# Patient Record
Sex: Female | Born: 1935 | Race: White | Hispanic: No | Marital: Single | Smoking: Never smoker
Health system: Southern US, Community
[De-identification: ages and names within clinical notes are randomized; demographics above are authoritative.]

## PROBLEM LIST (undated history)

## (undated) DIAGNOSIS — F0390 Unspecified dementia without behavioral disturbance: Secondary | ICD-10-CM

## (undated) DIAGNOSIS — E785 Hyperlipidemia, unspecified: Secondary | ICD-10-CM

## (undated) DIAGNOSIS — G40909 Epilepsy, unspecified, not intractable, without status epilepticus: Secondary | ICD-10-CM

## (undated) HISTORY — PX: TONSILLECTOMY: SUR1361

## (undated) HISTORY — PX: HYSTEROTOMY: SHX1776

---

## 2013-12-03 ENCOUNTER — Emergency Department: Payer: Self-pay | Admitting: Emergency Medicine

## 2014-01-24 ENCOUNTER — Emergency Department: Payer: Self-pay | Admitting: Emergency Medicine

## 2014-03-27 ENCOUNTER — Emergency Department: Payer: Self-pay | Admitting: Emergency Medicine

## 2015-07-23 ENCOUNTER — Encounter: Payer: Self-pay | Admitting: Internal Medicine

## 2015-07-23 ENCOUNTER — Emergency Department: Payer: Medicare Other

## 2015-07-23 ENCOUNTER — Inpatient Hospital Stay
Admission: EM | Admit: 2015-07-23 | Discharge: 2015-07-27 | DRG: 064 | Disposition: A | Discharge status: Hospice | Payer: Medicare Other | Attending: Internal Medicine | Admitting: Internal Medicine

## 2015-07-23 DIAGNOSIS — E785 Hyperlipidemia, unspecified: Secondary | ICD-10-CM | POA: Diagnosis present

## 2015-07-23 DIAGNOSIS — R29810 Facial weakness: Secondary | ICD-10-CM | POA: Diagnosis present

## 2015-07-23 DIAGNOSIS — Z66 Do not resuscitate: Secondary | ICD-10-CM | POA: Diagnosis present

## 2015-07-23 DIAGNOSIS — G40909 Epilepsy, unspecified, not intractable, without status epilepticus: Secondary | ICD-10-CM | POA: Diagnosis present

## 2015-07-23 DIAGNOSIS — N390 Urinary tract infection, site not specified: Secondary | ICD-10-CM | POA: Diagnosis not present

## 2015-07-23 DIAGNOSIS — Z7982 Long term (current) use of aspirin: Secondary | ICD-10-CM

## 2015-07-23 DIAGNOSIS — Z993 Dependence on wheelchair: Secondary | ICD-10-CM

## 2015-07-23 DIAGNOSIS — Z515 Encounter for palliative care: Secondary | ICD-10-CM | POA: Diagnosis present

## 2015-07-23 DIAGNOSIS — E860 Dehydration: Secondary | ICD-10-CM | POA: Diagnosis present

## 2015-07-23 DIAGNOSIS — W19XXXA Unspecified fall, initial encounter: Secondary | ICD-10-CM | POA: Diagnosis present

## 2015-07-23 DIAGNOSIS — E039 Hypothyroidism, unspecified: Secondary | ICD-10-CM | POA: Diagnosis present

## 2015-07-23 DIAGNOSIS — G9340 Encephalopathy, unspecified: Secondary | ICD-10-CM | POA: Diagnosis present

## 2015-07-23 DIAGNOSIS — I639 Cerebral infarction, unspecified: Secondary | ICD-10-CM | POA: Diagnosis not present

## 2015-07-23 DIAGNOSIS — Z79899 Other long term (current) drug therapy: Secondary | ICD-10-CM

## 2015-07-23 DIAGNOSIS — F0390 Unspecified dementia without behavioral disturbance: Secondary | ICD-10-CM | POA: Diagnosis present

## 2015-07-23 DIAGNOSIS — R4182 Altered mental status, unspecified: Secondary | ICD-10-CM

## 2015-07-23 HISTORY — DX: Unspecified dementia, unspecified severity, without behavioral disturbance, psychotic disturbance, mood disturbance, and anxiety: F03.90

## 2015-07-23 HISTORY — DX: Epilepsy, unspecified, not intractable, without status epilepticus: G40.909

## 2015-07-23 HISTORY — DX: Hyperlipidemia, unspecified: E78.5

## 2015-07-23 LAB — CBC WITH DIFFERENTIAL/PLATELET
BASOS PCT: 0 %
Basophils Absolute: 0 10*3/uL (ref 0–0.1)
EOS ABS: 0.1 10*3/uL (ref 0–0.7)
EOS PCT: 2 %
HCT: 41.4 % (ref 35.0–47.0)
Hemoglobin: 13.9 g/dL (ref 12.0–16.0)
Lymphocytes Relative: 26 %
Lymphs Abs: 1.3 10*3/uL (ref 1.0–3.6)
MCH: 31 pg (ref 26.0–34.0)
MCHC: 33.7 g/dL (ref 32.0–36.0)
MCV: 91.8 fL (ref 80.0–100.0)
MONO ABS: 0.4 10*3/uL (ref 0.2–0.9)
MONOS PCT: 7 %
Neutro Abs: 3.3 10*3/uL (ref 1.4–6.5)
Neutrophils Relative %: 65 %
Platelets: 160 10*3/uL (ref 150–440)
RBC: 4.51 MIL/uL (ref 3.80–5.20)
RDW: 13.1 % (ref 11.5–14.5)
WBC: 5.1 10*3/uL (ref 3.6–11.0)

## 2015-07-23 LAB — COMPREHENSIVE METABOLIC PANEL
ALBUMIN: 3.9 g/dL (ref 3.5–5.0)
ALT: 12 U/L — ABNORMAL LOW (ref 14–54)
ANION GAP: 7 (ref 5–15)
AST: 17 U/L (ref 15–41)
Alkaline Phosphatase: 82 U/L (ref 38–126)
BUN: 19 mg/dL (ref 6–20)
CO2: 29 mmol/L (ref 22–32)
Calcium: 9.1 mg/dL (ref 8.9–10.3)
Chloride: 103 mmol/L (ref 101–111)
Creatinine, Ser: 0.98 mg/dL (ref 0.44–1.00)
GFR calc non Af Amer: 52 mL/min — ABNORMAL LOW (ref >60)
GFR, EST AFRICAN AMERICAN: 60 mL/min — AB (ref >60)
GLUCOSE: 105 mg/dL — AB (ref 65–99)
POTASSIUM: 4 mmol/L (ref 3.5–5.1)
SODIUM: 139 mmol/L (ref 135–145)
TOTAL PROTEIN: 6.4 g/dL — AB (ref 6.5–8.1)
Total Bilirubin: 0.3 mg/dL (ref 0.3–1.2)

## 2015-07-23 LAB — URINALYSIS COMPLETE WITH MICROSCOPIC (ARMC ONLY)
BILIRUBIN URINE: NEGATIVE
GLUCOSE, UA: NEGATIVE mg/dL
Ketones, ur: NEGATIVE mg/dL
LEUKOCYTES UA: NEGATIVE
NITRITE: POSITIVE — AB
PH: 7 (ref 5.0–8.0)
Protein, ur: NEGATIVE mg/dL
SPECIFIC GRAVITY, URINE: 1.013 (ref 1.005–1.030)

## 2015-07-23 LAB — T4, FREE: Free T4: 0.83 ng/dL (ref 0.61–1.12)

## 2015-07-23 LAB — TROPONIN I: Troponin I: <0.03 ng/mL (ref <0.031)

## 2015-07-23 LAB — MRSA PCR SCREENING: MRSA BY PCR: NEGATIVE

## 2015-07-23 LAB — TSH: TSH: 2.5 u[IU]/mL (ref 0.350–4.500)

## 2015-07-23 LAB — MAGNESIUM: Magnesium: 1.9 mg/dL (ref 1.7–2.4)

## 2015-07-23 MED ORDER — ADULT MULTIVITAMIN W/MINERALS CH
1.0000 | ORAL_TABLET | Freq: Every day | ORAL | Status: DC
  Administered 2015-07-24 – 2015-07-27 (×3): 1 via ORAL
  Filled 2015-07-23 (×4): qty 1

## 2015-07-23 MED ORDER — ASPIRIN 81 MG PO CHEW
81.0000 mg | CHEWABLE_TABLET | Freq: Every day | ORAL | Status: DC
  Administered 2015-07-24 – 2015-07-25 (×2): 81 mg via ORAL
  Filled 2015-07-23 (×2): qty 1

## 2015-07-23 MED ORDER — ENOXAPARIN SODIUM 40 MG/0.4ML ~~LOC~~ SOLN
40.0000 mg | SUBCUTANEOUS | Status: DC
  Administered 2015-07-23 – 2015-07-26 (×4): 40 mg via SUBCUTANEOUS
  Filled 2015-07-23 (×4): qty 0.4

## 2015-07-23 MED ORDER — CALCIUM CARBONATE-VITAMIN D 500-200 MG-UNIT PO TABS
1.0000 | ORAL_TABLET | Freq: Two times a day (BID) | ORAL | Status: DC
  Administered 2015-07-24 – 2015-07-27 (×5): 1 via ORAL
  Filled 2015-07-23 (×8): qty 1

## 2015-07-23 MED ORDER — PHENYTOIN SODIUM EXTENDED 100 MG PO CAPS
100.0000 mg | ORAL_CAPSULE | Freq: Every day | ORAL | Status: DC
  Administered 2015-07-24 – 2015-07-27 (×4): 100 mg via ORAL
  Filled 2015-07-23 (×4): qty 1

## 2015-07-23 MED ORDER — CEFTRIAXONE SODIUM 1 G IJ SOLR
1.0000 g | INTRAMUSCULAR | Status: DC
  Administered 2015-07-24 – 2015-07-25 (×2): 1 g via INTRAVENOUS
  Filled 2015-07-23 (×3): qty 10

## 2015-07-23 MED ORDER — SODIUM CHLORIDE 0.9 % IV BOLUS (SEPSIS)
1000.0000 mL | Freq: Once | INTRAVENOUS | Status: AC
Start: 2015-07-23 — End: 2015-07-23
  Administered 2015-07-23: 1000 mL via INTRAVENOUS

## 2015-07-23 MED ORDER — FLUTICASONE PROPIONATE 50 MCG/ACT NA SUSP
2.0000 | Freq: Every day | NASAL | Status: DC
Start: 2015-07-23 — End: 2015-07-27
  Administered 2015-07-24 – 2015-07-26 (×3): 2 via NASAL
  Filled 2015-07-23: qty 16

## 2015-07-23 MED ORDER — DEXTROSE 5 % IV SOLN: 1.0000 g | INTRAVENOUS | Status: DC

## 2015-07-23 MED ORDER — LEVOTHYROXINE SODIUM 50 MCG PO TABS
50.0000 ug | ORAL_TABLET | Freq: Every day | ORAL | Status: DC
  Administered 2015-07-24 – 2015-07-27 (×4): 50 ug via ORAL
  Filled 2015-07-23 (×4): qty 1

## 2015-07-23 MED ORDER — SODIUM CHLORIDE 0.9 % IV SOLN
INTRAVENOUS | Status: DC
  Administered 2015-07-23 – 2015-07-27 (×7): via INTRAVENOUS

## 2015-07-23 MED ORDER — ONDANSETRON HCL 4 MG PO TABS
4.0000 mg | ORAL_TABLET | Freq: Four times a day (QID) | ORAL | Status: DC | PRN
  Filled 2015-07-23: qty 1

## 2015-07-23 MED ORDER — ACETAMINOPHEN 325 MG PO TABS: 650.0000 mg | ORAL_TABLET | Freq: Four times a day (QID) | ORAL | Status: DC | PRN

## 2015-07-23 MED ORDER — ACETAMINOPHEN 650 MG RE SUPP: 650.0000 mg | Freq: Four times a day (QID) | RECTAL | Status: DC | PRN

## 2015-07-23 MED ORDER — SERTRALINE HCL 100 MG PO TABS
100.0000 mg | ORAL_TABLET | Freq: Every day | ORAL | Status: DC
  Administered 2015-07-24 – 2015-07-27 (×4): 100 mg via ORAL
  Filled 2015-07-23 (×4): qty 1

## 2015-07-23 MED ORDER — DEXTROSE 5 % IV SOLN
1.0000 g | Freq: Once | INTRAVENOUS | Status: AC
  Administered 2015-07-23: 1 g via INTRAVENOUS
  Filled 2015-07-23: qty 10

## 2015-07-23 MED ORDER — ONDANSETRON HCL 4 MG/2ML IJ SOLN: 4.0000 mg | Freq: Four times a day (QID) | INTRAMUSCULAR | Status: DC | PRN

## 2015-07-23 NOTE — ED Provider Notes (Signed)
[facility] Emergency Department Provider Note   ____________________________________________  Time seen: Approximately 1:45 PM  I have reviewed the triage vital signs and the nursing notes.   HISTORY  Chief Complaint Altered Mental Status  Caveat-history of present illness and review of systems Limited due to the patient's dementia.  HPI Heidi Osborne is a 82 y.o. female with history of dementia and hypothyroidism who presents for evaluation of fall and change in mental status since yesterday. History is provided by EMS on arrival. Per their report, she lives at [facility], she fell yesterday, she has been altered, she has seemed to need more assistance and is leaning to the side and drooling. No other history is obtained.   No past medical history on file.  There are no active problems to display for this patient.   No past surgical history on file.  Current Outpatient Rx  Name  Route  Sig  Dispense  Refill  . aspirin 81 MG chewable tablet   Oral   Chew 81 mg by mouth daily.         . calcium-vitamin D (OSCAL WITH D) 500-200 MG-UNIT tablet   Oral   Take 1 tablet by mouth 2 (two) times daily.         . fluticasone (FLONASE) 50 MCG/ACT nasal spray   Each Nare   Place 2 sprays into both nostrils daily.         Marland Kitchen levothyroxine (SYNTHROID, LEVOTHROID) 50 MCG tablet   Oral   Take 50 mcg by mouth daily before breakfast.         . Multiple Vitamin (MULTIVITAMIN WITH MINERALS) TABS tablet   Oral   Take 1 tablet by mouth daily.         . phenytoin (DILANTIN) 100 MG ER capsule   Oral   Take 100 mg by mouth daily.         . sertraline (ZOLOFT) 100 MG tablet   Oral   Take 100 mg by mouth daily.           Allergies Review of patient's allergies indicates no known allergies.  No family history on file.  Social History Social History  Substance Use Topics  . Smoking status: Not on file  .  Smokeless tobacco: Not on file  . Alcohol Use: Not on file    Review of Systems  Caveat-history of present illness and review of systems Limited due to the patient's dementia. ____________________________________________   PHYSICAL EXAM:  VITAL SIGNS: ED Triage Vitals  Enc Vitals Group     BP 07/23/15 1258 136/78 mmHg     Pulse Rate 07/23/15 1258 69     Resp 07/23/15 1258 18     Temp 07/23/15 1258 98.4 F (36.9 C)     Temp Source 07/23/15 1258 Axillary     SpO2 07/23/15 1258 97 %     Weight 07/23/15 1258 134 lb 8 oz (61.009 kg)     Height 07/23/15 1258 5\' 4"  (1.626 m)     Head Cir --      Peak Flow --      Pain Score --      Pain Loc --      Pain Edu? --      Excl. in GC? --     Constitutional: Alert and oriented To self only but not to year or to place, confused, babbling but able to follow commands. in no acute distress. Eyes:  Conjunctivae are normal. PERRL. EOMI. Head: Atraumatic. Nose: No congestion/rhinnorhea. Mouth/Throat: Mucous membranes are dry.  Oropharynx non-erythematous. Neck: No stridor.  No cervical spine tenderness to palpation. Cardiovascular: Normal rate, regular rhythm. Grossly normal heart sounds.  Good peripheral circulation. Respiratory: Normal respiratory effort.  No retractions. Lungs CTAB. Gastrointestinal: Soft and nontender. No distention.  No CVA tenderness. Genitourinary: deferred Musculoskeletal: No lower extremity tenderness nor edema.  No joint effusions. Neurologic:  There does not appear to be any dysarthria but the exam is limited as the patient is babbling for the most part. She moves all extremities equally and to command. Skin:  Skin is warm, dry and intact. No rash noted. Psychiatric: Mood and affect are normal. Speech and behavior are normal.  ____________________________________________   LABS (all labs ordered are listed, but only abnormal results are displayed)  Labs Reviewed  COMPREHENSIVE METABOLIC PANEL - Abnormal;  Notable for the following:    Glucose, Bld 105 (*)    Total Protein 6.4 (*)    ALT 12 (*)    GFR calc non Af Amer 52 (*)    GFR calc Af Amer 60 (*)    All other components within normal limits  URINALYSIS COMPLETEWITH MICROSCOPIC ([facility] ONLY) - Abnormal; Notable for the following:    Color, Urine YELLOW (*)    APPearance CLOUDY (*)    Hgb urine dipstick 1+ (*)    Nitrite POSITIVE (*)    Bacteria, UA RARE (*)    Squamous Epithelial / LPF 0-5 (*)    All other components within normal limits  CULTURE, BLOOD (ROUTINE X 2)  CULTURE, BLOOD (ROUTINE X 2)  CBC WITH DIFFERENTIAL/PLATELET  TROPONIN I  MAGNESIUM  TSH  T4, FREE  PHENYTOIN LEVEL, FREE AND TOTAL   ____________________________________________  EKG  ED ECG REPORT I, Gayla Doss, the attending physician, personally viewed and interpreted this ECG.   Date: 07/23/2015  EKG Time: 13:01  Rate: 67  Rhythm: normal sinus rhythm  Axis: normal  Intervals: Left anterior fascicular block.  ST&T Change: No acute ST elevation. No acute ST depression.  ____________________________________________  RADIOLOGY  CT head IMPRESSION: Atrophy with periventricular small vessel disease. Prior small infarct anterior left thalamus. No acute infarct evident. No hemorrhage, extra-axial fluid collection, or mass effect.   CXR IMPRESSION: No acute cardiopulmonary disease on this AP portable examination.  ____________________________________________   PROCEDURES  Procedure(s) performed: None  Critical Care performed: No  ____________________________________________   INITIAL IMPRESSION / ASSESSMENT AND PLAN / ED COURSE  Pertinent labs & imaging results that were available during my care of the patient were reviewed by me and considered in my medical decision making (see chart for details).  Heidi Osborne is a 81 y.o. female with history of dementia and hypothyroidism who presents for evaluation of fall and change in  mental status since yesterday. Her vital signs are stable and she is afebrile, she does appear very confused but has a nonfocal neurological examination. I reviewed her labs, CBC and CMP unremarkable, negative troponin, urinalysis is concerning for nitrite positive UTI, we'll give ceftriaxone. No acute intracranial process on CT head, chest x-ray shows no acute cardiopulmonary disease. Suspect UTI as the most likely cause of her altered mental status, weakness, case discussed with the hospitalist, Dr. Allena Katz for admission at 3:45 PM. ____________________________________________   FINAL CLINICAL IMPRESSION(S) / ED DIAGNOSES  Final diagnoses:  UTI (lower urinary tract infection)  Altered mental status, unspecified altered mental status type  NEW MEDICATIONS STARTED DURING THIS VISIT:  New Prescriptions   No medications on file     Note:  This document was prepared using Dragon voice recognition software and may include unintentional dictation errors.    Gayla DossEryka A  Ducre, MD 07/23/15 [phone]

## 2015-07-23 NOTE — Care Management Obs Status (Signed)
MEDICARE OBSERVATION STATUS NOTIFICATION   Patient Details  Name: Heidi AguasHelen Osborne MRN: [id number] Date of Birth: January 19, 1930   Medicare Observation Status Notification Given:  Yes    Eber HongGreene, Emi Lymon R, RN 07/23/2015, 4:40 PM

## 2015-07-23 NOTE — ED Notes (Signed)
[facility] for reports of fall yesterday and altered mental status. Staff report pt seems to need more assistance today. Staff report pt leaning to one side and drooling. EMS 164/84, 72 HR, 98.2 axillary temperature, CBG 140.

## 2015-07-23 NOTE — H&P (Signed)
[facility] [place] at [facility]   PATIENT NAME: Heidi Osborne    MR#:  [id number]  DATE OF BIRTH:  Nov 24, 1929  DATE OF ADMISSION:  07/23/2015  PRIMARY CARE PHYSICIAN: CRIM, [place] DAVID, MD   REQUESTING/REFERRING PHYSICIAN: Dr. Toney Rakes  CHIEF COMPLAINT:   Chief Complaint  Patient presents with  . Altered Mental Status    HISTORY OF PRESENT ILLNESS: Heidi Osborne  is a 82 y.o. female with a known history of  Epilepsy, dementia, hyperlipidemia, hypothyroidism brought into the emergency room with altered mental status. Patient's daughter is at the bedside and is able to provide her history. She reports that her mother normally is wheelchair bound for the past few years. But she does try to get out of bed and earlier this morning she fell and was on the floor but did not have any injuries. Then around afternoon time they noticed that she was more confused and had a facial droop. Patient brought to the ER. She had a CT scan of the head which is normal daughter reports that she's back to her baseline. She did have a abnormal UA. Patient with dementia and unable to provide any review of systems.        PAST MEDICAL HISTORY:   Past Medical History  Diagnosis Date  . Epilepsy (HCC)   . Dementia   . Hyperlipemia     PAST SURGICAL HISTORY: Past Surgical History  Procedure Laterality Date  . Hysterotomy    . Tonsillectomy      SOCIAL HISTORY:  Social History  Substance Use Topics  . Smoking status: Never Smoker   . Smokeless tobacco: Not on file  . Alcohol Use: No    FAMILY HISTORY:  Family History  Problem Relation Age of Onset  . Colon cancer    . Heart disease    . Crohn's disease      DRUG ALLERGIES: No Known Allergies  REVIEW OF SYSTEMS:   CONSTITUTIONAL: Unable to provide MEDICATIONS AT HOME:  Prior to Admission medications   Medication Sig Start Date End Date Taking? Authorizing Provider  aspirin 81 MG chewable tablet Chew 81  mg by mouth daily.   Yes Historical Provider, MD  calcium-vitamin D (OSCAL WITH D) 500-200 MG-UNIT tablet Take 1 tablet by mouth 2 (two) times daily.   Yes Historical Provider, MD  fluticasone (FLONASE) 50 MCG/ACT nasal spray Place 2 sprays into both nostrils daily.   Yes Historical Provider, MD  levothyroxine (SYNTHROID, LEVOTHROID) 50 MCG tablet Take 50 mcg by mouth daily before breakfast.   Yes Historical Provider, MD  Multiple Vitamin (MULTIVITAMIN WITH MINERALS) TABS tablet Take 1 tablet by mouth daily.   Yes Historical Provider, MD  phenytoin (DILANTIN) 100 MG ER capsule Take 100 mg by mouth daily.   Yes Historical Provider, MD  sertraline (ZOLOFT) 100 MG tablet Take 100 mg by mouth daily.   Yes Historical Provider, MD      PHYSICAL EXAMINATION:   VITAL SIGNS: Blood pressure 136/78, pulse 69, temperature 98.4 F (36.9 C), temperature source Axillary, resp. rate 18, height 5\' 4"  (1.626 m), weight 61.009 kg (134 lb 8 oz), SpO2 97 %.  GENERAL:  82 y.o.-year-old patient lying in the bed with no acute distress.  EYES: Pupils equal, round, reactive to light and accommodation. No scleral icterus. Extraocular muscles intact.  HEENT: Head atraumatic, normocephalic. Oropharynx and nasopharynx clear.  NECK:  Supple, no jugular venous distention. No thyroid enlargement, no tenderness.  LUNGS: Normal  breath sounds bilaterally, no wheezing, rales,rhonchi or crepitation. No use of accessory muscles of respiration.  CARDIOVASCULAR: S1, S2 normal. No murmurs, rubs, or gallops.  ABDOMEN: Soft, nontender, nondistended. Bowel sounds present. No organomegaly or mass.  EXTREMITIES: No pedal edema, cyanosis, or clubbing.  NEUROLOGIC: Cranial nerves II through XII are intact. Muscle strength 5/5 in all extremities. Sensation intact. Gait not checked.  PSYCHIATRIC: The patient is alert and oriented x 3.  SKIN: No obvious rash, lesion, or ulcer.   LABORATORY PANEL:   CBC  Recent Labs Lab 07/23/15 1310   WBC 5.1  HGB 13.9  HCT 41.4  PLT 160  MCV 91.8  MCH 31.0  MCHC 33.7  RDW 13.1  LYMPHSABS 1.3  MONOABS 0.4  EOSABS 0.1  BASOSABS 0.0   ------------------------------------------------------------------------------------------------------------------  Chemistries   Recent Labs Lab 07/23/15 1310  NA 139  K 4.0  CL 103  CO2 29  GLUCOSE 105*  BUN 19  CREATININE 0.98  CALCIUM 9.1  MG 1.9  AST 17  ALT 12*  ALKPHOS 82  BILITOT 0.3   ------------------------------------------------------------------------------------------------------------------ estimated creatinine clearance is 36.9 mL/min (by C-G formula based on Cr of 0.98). ------------------------------------------------------------------------------------------------------------------  Recent Labs  07/23/15 1310  TSH 2.500     Coagulation profile No results for input(s): INR, PROTIME in the last 168 hours. ------------------------------------------------------------------------------------------------------------------- No results for input(s): DDIMER in the last 72 hours. -------------------------------------------------------------------------------------------------------------------  Cardiac Enzymes  Recent Labs Lab 07/23/15 1310  TROPONINI <0.03   ------------------------------------------------------------------------------------------------------------------ Invalid input(s): POCBNP  ---------------------------------------------------------------------------------------------------------------  Urinalysis    Component Value Date/Time   COLORURINE YELLOW* 07/23/2015 1312   APPEARANCEUR CLOUDY* 07/23/2015 1312   LABSPEC 1.013 07/23/2015 1312   PHURINE 7.0 07/23/2015 1312   GLUCOSEU NEGATIVE 07/23/2015 1312   HGBUR 1+* 07/23/2015 1312   BILIRUBINUR NEGATIVE 07/23/2015 1312   KETONESUR NEGATIVE 07/23/2015 1312   PROTEINUR NEGATIVE 07/23/2015 1312   NITRITE POSITIVE* 07/23/2015 1312    LEUKOCYTESUR NEGATIVE 07/23/2015 1312     RADIOLOGY: Ct Head Wo Contrast  07/23/2015  CLINICAL DATA:  Pain and weakness following fall ; altered mental status EXAM: CT HEAD WITHOUT CONTRAST TECHNIQUE: Contiguous axial images were obtained from the base of the skull through the vertex without intravenous contrast. COMPARISON:  March 27, 2014 FINDINGS: Moderate diffuse atrophy is stable. There is no intracranial mass, hemorrhage, extra-axial fluid collection, or midline shift. There is patchy small vessel disease throughout the centra semiovale bilaterally. There is a prior small infarct in the inferior anterior left thalamus, stable. No acute infarct is evident. The bony calvarium appears intact. The mastoid air cells are clear. No intraorbital lesions are evident. IMPRESSION: Atrophy with periventricular small vessel disease. Prior small infarct anterior left thalamus. No acute infarct evident. No hemorrhage, extra-axial fluid collection, or mass effect. Electronically Signed   By: Bretta BangWilliam  Woodruff III M.D.   On: 07/23/2015 14:48   Dg Chest Portable 1 View  07/23/2015  CLINICAL DATA:  Fall yesterday.  Altered mental status. EXAM: PORTABLE CHEST 1 VIEW COMPARISON:  None. FINDINGS: Borderline enlarged cardiac silhouette and mediastinal contours. Punctate (approximately 5 mm) granuloma overlies the medial aspect of the left upper lung. No focal airspace opacities. No pleural effusion. No evidence of edema. No acute osseous abnormalities. IMPRESSION: No acute cardiopulmonary disease on this AP portable examination. Electronically Signed   By: Simonne ComeJohn  Watts M.D.   On: 07/23/2015 13:57    EKG: Orders placed or performed during the hospital encounter of 07/23/15  . ED EKG  . ED EKG  .  EKG 12-Lead  . EKG 12-Lead    IMPRESSION AND PLAN: Patient is 82 year old white female with dementia who is being admitted after fall and mild confusion  1. Acute encephalopathy due to combination of dehydration and  UTI We'll continue IV fluids Obtain urine cultures IV ceftriaxone  2. Hypothyroidism continue level thyroxine  3. Seizure  Dilantin  4. dmentia not on any medications Continue zoloft  5. Code DNR    All the records are reviewed and case discussed with ED provider. Management plans discussed with the patient, family and they are in agreement.  CODE STATUS: Code Status History    This patient does not have a recorded code status. Please follow your organizational policy for patients in this situation.       TOTAL TIME TAKING CARE OF THIS PATIENT:55 minutes.    Auburn Bilberry M.D on 07/23/2015 at 4:08 PM  Between 7am to 6pm - Pager - [phone]  After 6pm go to [email or web address] - password [email or web address] [facility]  [place] [facility]  Office  [phone]  CC: Primary care physician; CRIM, [place] DAVID, MD

## 2015-07-24 ENCOUNTER — Observation Stay: Payer: Medicare Other

## 2015-07-24 DIAGNOSIS — I639 Cerebral infarction, unspecified: Secondary | ICD-10-CM | POA: Diagnosis present

## 2015-07-24 DIAGNOSIS — G40909 Epilepsy, unspecified, not intractable, without status epilepticus: Secondary | ICD-10-CM | POA: Diagnosis present

## 2015-07-24 DIAGNOSIS — R29810 Facial weakness: Secondary | ICD-10-CM | POA: Diagnosis present

## 2015-07-24 DIAGNOSIS — Z515 Encounter for palliative care: Secondary | ICD-10-CM | POA: Diagnosis present

## 2015-07-24 DIAGNOSIS — G9340 Encephalopathy, unspecified: Secondary | ICD-10-CM | POA: Diagnosis present

## 2015-07-24 DIAGNOSIS — E860 Dehydration: Secondary | ICD-10-CM | POA: Diagnosis present

## 2015-07-24 DIAGNOSIS — Z7982 Long term (current) use of aspirin: Secondary | ICD-10-CM | POA: Diagnosis not present

## 2015-07-24 DIAGNOSIS — E785 Hyperlipidemia, unspecified: Secondary | ICD-10-CM | POA: Diagnosis present

## 2015-07-24 DIAGNOSIS — F0390 Unspecified dementia without behavioral disturbance: Secondary | ICD-10-CM | POA: Diagnosis present

## 2015-07-24 DIAGNOSIS — Z993 Dependence on wheelchair: Secondary | ICD-10-CM | POA: Diagnosis not present

## 2015-07-24 DIAGNOSIS — E039 Hypothyroidism, unspecified: Secondary | ICD-10-CM | POA: Diagnosis present

## 2015-07-24 DIAGNOSIS — Z79899 Other long term (current) drug therapy: Secondary | ICD-10-CM | POA: Diagnosis not present

## 2015-07-24 DIAGNOSIS — N390 Urinary tract infection, site not specified: Secondary | ICD-10-CM | POA: Diagnosis present

## 2015-07-24 DIAGNOSIS — Z66 Do not resuscitate: Secondary | ICD-10-CM | POA: Diagnosis present

## 2015-07-24 LAB — CBC
HEMATOCRIT: 37.3 % (ref 35.0–47.0)
HEMOGLOBIN: 12.3 g/dL (ref 12.0–16.0)
MCH: 30.9 pg (ref 26.0–34.0)
MCHC: 32.9 g/dL (ref 32.0–36.0)
MCV: 93.7 fL (ref 80.0–100.0)
Platelets: 141 10*3/uL — ABNORMAL LOW (ref 150–440)
RBC: 3.98 MIL/uL (ref 3.80–5.20)
RDW: 13.4 % (ref 11.5–14.5)
WBC: 4.3 10*3/uL (ref 3.6–11.0)

## 2015-07-24 LAB — BLOOD CULTURE ID PANEL (REFLEXED)
Acinetobacter baumannii: NOT DETECTED
CANDIDA ALBICANS: NOT DETECTED
CANDIDA GLABRATA: NOT DETECTED
CANDIDA PARAPSILOSIS: NOT DETECTED
CANDIDA TROPICALIS: NOT DETECTED
Candida krusei: NOT DETECTED
Carbapenem resistance: NOT DETECTED
ENTEROBACTER CLOACAE COMPLEX: NOT DETECTED
ENTEROBACTERIACEAE SPECIES: NOT DETECTED
ESCHERICHIA COLI: NOT DETECTED
Enterococcus species: NOT DETECTED
Haemophilus influenzae: NOT DETECTED
KLEBSIELLA OXYTOCA: NOT DETECTED
KLEBSIELLA PNEUMONIAE: NOT DETECTED
Listeria monocytogenes: NOT DETECTED
Methicillin resistance: NOT DETECTED
Neisseria meningitidis: NOT DETECTED
PROTEUS SPECIES: NOT DETECTED
PSEUDOMONAS AERUGINOSA: NOT DETECTED
STAPHYLOCOCCUS AUREUS BCID: NOT DETECTED
STREPTOCOCCUS AGALACTIAE: NOT DETECTED
STREPTOCOCCUS PNEUMONIAE: NOT DETECTED
Serratia marcescens: NOT DETECTED
Staphylococcus species: DETECTED — AB
Streptococcus pyogenes: NOT DETECTED
Streptococcus species: NOT DETECTED
VANCOMYCIN RESISTANCE: NOT DETECTED

## 2015-07-24 LAB — BASIC METABOLIC PANEL
ANION GAP: 3 — AB (ref 5–15)
BUN: 16 mg/dL (ref 6–20)
CHLORIDE: 110 mmol/L (ref 101–111)
CO2: 28 mmol/L (ref 22–32)
Calcium: 8.3 mg/dL — ABNORMAL LOW (ref 8.9–10.3)
Creatinine, Ser: 0.85 mg/dL (ref 0.44–1.00)
GFR calc Af Amer: >60 mL/min (ref >60)
GLUCOSE: 83 mg/dL (ref 65–99)
POTASSIUM: 3.6 mmol/L (ref 3.5–5.1)
Sodium: 141 mmol/L (ref 135–145)

## 2015-07-24 LAB — PHENYTOIN LEVEL, FREE AND TOTAL
PHENYTOIN FREE: NOT DETECTED ug/mL (ref 1.0–2.0)
PHENYTOIN, TOTAL: 3.1 ug/mL — AB (ref 10.0–20.0)

## 2015-07-24 MED ORDER — GADOBENATE DIMEGLUMINE 529 MG/ML IV SOLN
10.0000 mL | Freq: Once | INTRAVENOUS | Status: AC | PRN
  Administered 2015-07-24: 10 mL via INTRAVENOUS

## 2015-07-24 MED ORDER — ASPIRIN 300 MG RE SUPP
300.0000 mg | Freq: Every day | RECTAL | Status: DC
  Administered 2015-07-25: 11:00:00 300 mg via RECTAL
  Filled 2015-07-24: qty 1

## 2015-07-24 NOTE — Progress Notes (Signed)
Pharmacy Antibiotic Note  Heidi AguasHelen Osborne is a 82 y.o. female admitted on 07/23/2015 with possible CVA.  Patient is currently ordered Ceftriaxone 1g IV q24h for possible UTI.  Plan: BCID results were discussed with Dr. Enedina FinnerSona Patel. Will continue with current therapy.  Height: 5\' 5"  (165.1 cm) Weight: 125 lb 9.6 oz (56.972 kg) IBW/kg (Calculated) : 57  Temp (24hrs), Avg:98.1 F (36.7 C), Min:97.8 F (36.6 C), Max:98.4 F (36.9 C)   Recent Labs Lab 07/23/15 1310 07/24/15 0426  WBC 5.1 4.3  CREATININE 0.98 0.85    Estimated Creatinine Clearance: 44.3 mL/min (by C-G formula based on Cr of 0.85).    No Known Allergies  Antimicrobials this admission: Ceftriaxone 6/8 >>   Dose adjustments this admission:  Microbiology results: 6/8 BCx: 1 of 4 GPC, BCID: staph species, MEC A not detected  Thank you for allowing pharmacy to be a part of this patient's care.  Clovia CuffLisa Kaylenn Civil, PharmD, BCPS 07/24/2015 4:35 PM

## 2015-07-24 NOTE — Progress Notes (Signed)
Patient ID: Wendelyn Kiesling, female   DOB: 1929-04-14, 82 y.o.   MRN: [id number] [facility] - [place] at [facility]   PATIENT NAME: Melessia Kaus    MR#:  [id number]  DATE OF BIRTH:  [birth year]  SUBJECTIVE:   Patient very lethargic. Sleepy. Not able to give any history of previous system. Spoke with daughter at length over the phone. Noted to have left facial droop. She is wheelchair bound secondary to tibial plateau fracture for last 3 years. REVIEW OF SYSTEMS:   Review of Systems  Unable to perform ROS: acuity of condition   Tolerating Diet:npo Tolerating PT: penidng  DRUG ALLERGIES:  No Known Allergies  VITALS:  Blood pressure 151/73, pulse 80, temperature 98.4 F (36.9 C), temperature source Oral, resp. rate 20, height  (1.651 m), weight 56.972 kg (125 lb 9.6 oz), SpO2 93 %.  PHYSICAL EXAMINATION:   Physical Exam  GENERAL:  82 y.o.-year-old patient lying in the bed with no acute distress. Thi, fraile EYES: Pupils equal, round, reactive to light and accommodation. No scleral icterus. Extraocular muscles intact.  HEENT: Head atraumatic, normocephalic. Oropharynx and nasopharynx clear.  NECK:  Supple, no jugular venous distention. No thyroid enlargement, no tenderness.  LUNGS: Normal breath sounds bilaterally, no wheezing, rales, rhonchi. No use of accessory muscles of respiration.  CARDIOVASCULAR: S1, S2 normal. No murmurs, rubs, or gallops.  ABDOMEN: Soft, nontender, nondistended. Bowel sounds present. No organomegaly or mass.  EXTREMITIES: No cyanosis, clubbing or edema b/l.    NEUROLOGIC: pt is lethargic and unable to assess. Left facial droop noted. Moves all extremities spontaneously well. PSYCHIATRIC:  patient is sleepy/lethargic  SKIN: No obvious rash, lesion, or ulcer.   LABORATORY PANEL:  CBC  Recent Labs Lab 07/24/15 0426  WBC 4.3  HGB 12.3  HCT 37.3  PLT 141*    Chemistries   Recent Labs Lab 07/23/15 1310  07/24/15 0426  NA 139 141  K 4.0 3.6  CL 103 110  CO2 29 28  GLUCOSE 105* 83  BUN 19 16  CREATININE 0.98 0.85  CALCIUM 9.1 8.3*  MG 1.9  --   AST 17  --   ALT 12*  --   ALKPHOS 82  --   BILITOT 0.3  --    Cardiac Enzymes  Recent Labs Lab 07/23/15 1310  TROPONINI <0.03   RADIOLOGY:  Ct Head Wo Contrast  07/23/2015  CLINICAL DATA:  Pain and weakness following fall ; altered mental status EXAM: CT HEAD WITHOUT CONTRAST TECHNIQUE: Contiguous axial images were obtained from the base of the skull through the vertex without intravenous contrast. COMPARISON:  March 27, 2014 FINDINGS: Moderate diffuse atrophy is stable. There is no intracranial mass, hemorrhage, extra-axial fluid collection, or midline shift. There is patchy small vessel disease throughout the centra semiovale bilaterally. There is a prior small infarct in the inferior anterior left thalamus, stable. No acute infarct is evident. The bony calvarium appears intact. The mastoid air cells are clear. No intraorbital lesions are evident. IMPRESSION: Atrophy with periventricular small vessel disease. Prior small infarct anterior left thalamus. No acute infarct evident. No hemorrhage, extra-axial fluid collection, or mass effect. Electronically Signed   By: Bretta Bang III M.D.   On: 07/23/2015 14:48   Dg Chest Portable 1 View  07/23/2015  CLINICAL DATA:  Fall yesterday.  Altered mental status. EXAM: PORTABLE CHEST 1 VIEW COMPARISON:  None. FINDINGS: Borderline enlarged cardiac silhouette and mediastinal contours. Punctate (approximately 5 mm) granuloma overlies the medial  aspect of the left upper lung. No focal airspace opacities. No pleural effusion. No evidence of edema. No acute osseous abnormalities. IMPRESSION: No acute cardiopulmonary disease on this AP portable examination. Electronically Signed   By: Simonne ComeJohn  Watts M.D.   On: 07/23/2015 13:57   ASSESSMENT AND PLAN:  82 year old white female with dementia who is being  admitted after fall and mild confusion  1. Acute encephalopathy due to combination of dehydration and UTIRule out possible stroke We'll continue IV fluids IV ceftriaxone Her white count is normal.  2. Hypothyroidism continue level thyroxine  3. Seizure  Dilantin  4. dementia not on any medications Continue zoloft  5. Left facial droop with lethargic increasing and coughing spell while eating. Rule out stroke Get stat MRI of the brain -Discussed with Dter. Jan  Case discussed with Care Management/Social Worker. Management plans discussed with the patient, family and they are in agreement.  CODE STATUS: DO NOT RESUSCITATE  DVT Prophylaxis: Lovenox   TOTAL TIME TAKING CARE OF THIS PATIENT: 30 minutes.  >50% time spent on counselling and coordination of care  Note: This dictation was prepared with Dragon dictation along with smaller phrase technology. Any transcriptional errors that result from this process are unintentional.  Ruthann Angulo M.D on 07/24/2015 at 1:39 PM  Between 7am to 6pm - Pager - [phone]  After 6pm go to [email or web address] - password [email or web address] [facility]  [place] [facility] [place] [facility]  Office  [phone]  CC: Primary care physician; CRIM, [place] DAVID, MD

## 2015-07-24 NOTE — Care Management (Signed)
Admitted to [facility] with the diagnosis of fall. 1st admission to this facility. Daughter is Marylu LundJanet [phone]). [facility]. Dr. Roselyn Beringrim of [facility] is listed as primary care physician. Wheelchair bound at facility per history & physical Gwenette GreetBrenda S Leiann Sporer RN MSN CCM Care Management [phone]

## 2015-07-25 MED ORDER — HYDRALAZINE HCL 20 MG/ML IJ SOLN
10.0000 mg | Freq: Four times a day (QID) | INTRAMUSCULAR | Status: DC | PRN
  Administered 2015-07-25 – 2015-07-26 (×2): 10 mg via INTRAVENOUS
  Filled 2015-07-25 (×2): qty 1

## 2015-07-25 MED ORDER — ASPIRIN-DIPYRIDAMOLE ER 25-200 MG PO CP12
1.0000 | ORAL_CAPSULE | Freq: Two times a day (BID) | ORAL | Status: DC
  Administered 2015-07-25 – 2015-07-27 (×5): 1 via ORAL
  Filled 2015-07-25 (×6): qty 1

## 2015-07-25 NOTE — Plan of Care (Signed)
Problem: Nutrition: Goal: Adequate nutrition will be maintained Outcome: Not Progressing Pt aspiration risk. Currently NPO   Problem: Self-Care: Goal: Ability to participate in self-care as condition permits will improve Outcome: Not Applicable Date Met:  25/05/39 Pt is wheelchair bound. Totally dependent

## 2015-07-25 NOTE — Clinical Social Work Note (Addendum)
Clinical Social Work Assessment  Patient Details  Name: Heidi Osborne MRN: [id number] Date of Birth: [birth year]  Date of referral:  07/25/15               Reason for consult:  Discharge Planning                Permission sought to share information with:  Family Supports Permission granted to share information::     Name::        Agency::     Relationship::   Heidi Osborne- Daughter)  Contact Information:     Housing/Transportation Living arrangements for the past 2 months:  [place] ([place]) Source of Information:  Patient Patient Interpreter Needed:  None Criminal Activity/Legal Involvement Pertinent to Current Situation/Hospitalization:  No - Comment as needed Significant Relationships:  Adult Children Lives with:  Facility Resident [facility]) Do you feel safe going back to the place where you live?  Yes Need for family participation in patient care:  Yes (Comment) Heidi Osborne- Daughter)  Care giving concerns:  Patient is from [place]   Social Worker assessment / plan:  CSW received consult stating patient is from [place]. CSW met with patient and her daughter at bedside. Patient was asleep. Patient's daughter reports that patient has lived at [facility] for about 2 years. Stated she'd like patient to return to [facility] if appropriate. Granted verbal permission to discuss discharge plans with [facility]. Stated she's interested in possible [facility] services. RNCM made aware. Also open to Hospice. CSW contacted [facility]. They reported that patient can return at discharge. Stated they will arrange hospice if she needs it but they'll need a hospice order. Also reported that they use [facility] services in their facility. FL2 completed. CSW will continue to follow and assist.   Employment status:  Retired Forensic scientist:  Medicare PT Recommendations:  Not assessed at this time Information / Referral to community resources:      Patient/Family's Response to care:  Patient's family reports they are pleased with the care. Appreciative of CSW assistance.   Patient/Family's Understanding of and Emotional Response to Diagnosis, Current Treatment, and Prognosis:  Patient family in agreement with returning to [facility]. They report they understand why patient was admitted into [facility]  Emotional Assessment Appearance:  Appears stated age Attitude/Demeanor/Rapport:  Lethargic Affect (typically observed):  Calm Orientation:  Oriented to Self Alcohol / Substance use:  Not Applicable Psych involvement (Current and /or in the community):  No (Comment)  Discharge Needs  Concerns to be addressed:  Discharge Planning Concerns Readmission within the last 30 days:  No Current discharge risk:  Chronically ill Barriers to Discharge:  Continued Medical Work up   [employer], LCSW 07/25/2015, 3:16 PM

## 2015-07-25 NOTE — Plan of Care (Signed)
Problem: SLP Dysphagia Goals Goal: Misc Dysphagia Goal Pt will safely tolerate po diet of least restrictive consistency w/ no overt s/s of aspiration noted by Staff/pt/family x3 sessions.    

## 2015-07-25 NOTE — Evaluation (Signed)
Clinical/Bedside Swallow Evaluation Patient Details  Name: Heidi Osborne MRN: [id number] Date of Birth: 1929/09/27  Today's Date: 07/25/2015 Time: SLP Start Time (ACUTE ONLY): 7829 SLP Stop Time (ACUTE ONLY): 0745 SLP Time Calculation (min) (ACUTE ONLY): 50 min  Past Medical History:  Past Medical History  Diagnosis Date  . Epilepsy (HCC)   . Dementia   . Hyperlipemia    Past Surgical History:  Past Surgical History  Procedure Laterality Date  . Hysterotomy    . Tonsillectomy     HPI:  Pt is a 82 y.o. female with a known history of Epilepsy, dementia, hyperlipidemia, hypothyroidism brought into the emergency room with altered mental status. Patient's daughter is at the bedside and is able to provide her history. She reports that her mother normally is wheelchair bound for the past few years. But she does try to get out of bed and earlier this morning she fell and was on the floor but did not have any injuries. Then around afternoon time they noticed that she was more confused and had a facial droop. Patient brought to the ER. She had a CT scan of the head which is normal daughter reports that she's back to her baseline. Acute infarction affecting the right basal ganglia/posterior limb internal capsule. Pt awakened to stim but eyes remained closed; pt only gave a few verbal responses to basic questions. Slower responses.   Assessment / Plan / Recommendation Clinical Impression  Pt appeared to present w/ oral phase dysphagia c/b decreased bolus awareness and decreased attention to boluses for lingual movements and bolus manipulation. Increased oral phase time was noted w/ slow A-P transfer b/f completion of the pharyngeal swallow. Pt appeared to clear adequately b/t all trials w/ no oral residue remaining. Of note, pt tended to use min. more lingual movements to the R side when clearing orally. No overt s/s of aspiration noted w/ all po trials, however, pt is at increased risk for  coughing/choking (pharyngeal phase deficits) d/t the oral phase deficits. Pt was unable to feed self and required total assist. Recommend a Dysphagia 1(puree) diet w/ Honey consistency liquids w/ strict aspiration precautions; meds in Puree. NSG updated. Dietician consulted.     Aspiration Risk  Moderate aspiration risk    Diet Recommendation  Dysphagia 1 (puree) w/ thin liquids; aspiration precautions; feeding assistance  Medication Administration: Crushed with puree    Other  Recommendations Recommended Consults:  (Dietician) Oral Care Recommendations: Oral care BID;Staff/trained caregiver to provide oral care Other Recommendations: Order thickener from pharmacy;Prohibited food (jello, ice cream, thin soups);Remove water pitcher;Have oral suction available   Follow up Recommendations  Skilled Nursing facility (TBD)    Frequency and Duration min 3x week  2 weeks       Prognosis Prognosis for Safe Diet Advancement: Fair Barriers to Reach Goals: Cognitive deficits (drowsy)      Swallow Study   General Date of Onset: 07/23/15 HPI: Pt is a 82 y.o. female with a known history of Epilepsy, dementia, hyperlipidemia, hypothyroidism brought into the emergency room with altered mental status. Patient's daughter is at the bedside and is able to provide her history. She reports that her mother normally is wheelchair bound for the past few years. But she does try to get out of bed and earlier this morning she fell and was on the floor but did not have any injuries. Then around afternoon time they noticed that she was more confused and had a facial droop. Patient brought to the ER. She  had a CT scan of the head which is normal daughter reports that she's back to her baseline. Acute infarction affecting the right basal ganglia/posterior limb internal capsule. Pt awakened to stim but eyes remained closed; pt only gave a few verbal responses to basic questions. Slower responses. Type of Study: Bedside  Swallow Evaluation Previous Swallow Assessment: none Diet Prior to this Study:  (unknown) Temperature Spikes Noted: No (wbc not elevated) Respiratory Status: Room air History of Recent Intubation: No Behavior/Cognition: Cooperative;Lethargic/Drowsy;Distractible;Requires cueing;Confused (awake but required cues to attend) Oral Cavity Assessment: Within Functional Limits (min dry) Oral Care Completed by SLP: Yes Oral Cavity - Dentition: Adequate natural dentition Vision:  (n/a) Self-Feeding Abilities: Total assist Patient Positioning: Upright in bed Baseline Vocal Quality: Low vocal intensity (few responses) Volitional Cough: Cognitively unable to elicit Volitional Swallow: Unable to elicit    Oral/Motor/Sensory Function Overall Oral Motor/Sensory Function: Mild impairment Facial ROM: Reduced left (slight) Facial Symmetry: Abnormal symmetry left (slight) Facial Strength:  (fair-good) Lingual ROM: Reduced left (slight) Lingual Symmetry:  (fair-good)   Ice Chips Ice chips: Impaired Presentation: Spoon (fed; 2 trials) Oral Phase Impairments: Poor awareness of bolus;Reduced lingual movement/coordination Oral Phase Functional Implications: Oral holding;Prolonged oral transit Pharyngeal Phase Impairments:  (none) Other Comments: increased risk for pharygneal phase deficits   Thin Liquid Thin Liquid: Not tested    Nectar Thick Nectar Thick Liquid: Not tested   Honey Thick Honey Thick Liquid: Impaired Presentation: Spoon (fed; ~2 ozs) Oral Phase Impairments: Poor awareness of bolus;Reduced lingual movement/coordination Oral Phase Functional Implications: Prolonged oral transit;Oral holding Pharyngeal Phase Impairments:  (none) Other Comments: increased risk for pharyngeal phase deficits   Puree Puree: Impaired Presentation: Spoon (fed; 6 trials) Oral Phase Impairments: Reduced lingual movement/coordination;Poor awareness of bolus Oral Phase Functional Implications: Prolonged oral  transit;Oral holding Pharyngeal Phase Impairments:  (none) Other Comments: increased risk for pharygneal phase deficits   Solid   GO   Solid: Not tested       Jerilynn SomKatherine Aric Jost, MS, CCC-SLP  Shervin Cypert 07/25/2015,10:52 AM

## 2015-07-25 NOTE — Progress Notes (Signed)
Patient ID: Heidi AguasHelen Osborne, female   DOB: 20-Dec-1929, 82 y.o.   MRN: [id number] [facility] [place] [facility]   PATIENT NAME: Heidi AguasHelen Osborne    MR#:  [id number]  DATE OF BIRTH:  20-Dec-1929  SUBJECTIVE:   Patient  Sleepy.Able to answer simple questions swallowed medications today. Work with Human resources officerspeech therapist. Daughter in the room.  REVIEW OF SYSTEMS:   Review of Systems  Unable to perform ROS: acuity of condition   Tolerating DietPer speech therapy   PT: penidng  DRUG ALLERGIES:  No Known Allergies  VITALS:  Blood pressure 172/83, pulse 74, temperature 97.5 F (36.4 C), temperature source Oral, resp. rate 18, height 5\' 5"  (1.651 m), weight 56.972 kg (125 lb 9.6 oz), SpO2 93 %.  PHYSICAL EXAMINATION:   Physical Exam  GENERAL:  82 y.o.-year-old patient lying in the bed with no acute distress. Thi, fraile EYES: Pupils equal, round, reactive to light and accommodation. No scleral icterus. Extraocular muscles intact.  HEENT: Head atraumatic, normocephalic. Oropharynx and nasopharynx clear. Left facial droop.  NECK:  Supple, no jugular venous distention. No thyroid enlargement, no tenderness.  LUNGS: Normal breath sounds bilaterally, no wheezing, rales, rhonchi. No use of accessory muscles of respiration.  CARDIOVASCULAR: S1, S2 normal. No murmurs, rubs, or gallops.  ABDOMEN: Soft, nontender, nondistended. Bowel sounds present. No organomegaly or mass.  EXTREMITIES: No cyanosis, clubbing or edema b/l.    NEUROLOGIC: pt is lethargic and unable to assess. Left facial droop noted. Moves all extremities spontaneously well. PSYCHIATRIC:  patient is sleepy /lethargic Answering some questions  SKIN: No obvious rash, lesion, or ulcer.   LABORATORY PANEL:  CBC  Recent Labs Lab 07/24/15 0426  WBC 4.3  HGB 12.3  HCT 37.3  PLT 141*    Chemistries   Recent Labs Lab 07/23/15 1310 07/24/15 0426  NA 139 141  K 4.0 3.6  CL 103 110  CO2 29 28  GLUCOSE  105* 83  BUN 19 16  CREATININE 0.98 0.85  CALCIUM 9.1 8.3*  MG 1.9  --   AST 17  --   ALT 12*  --   ALKPHOS 82  --   BILITOT 0.3  --    Cardiac Enzymes  Recent Labs Lab 07/23/15 1310  TROPONINI <0.03   RADIOLOGY:  Ct Head Wo Contrast  07/23/2015  CLINICAL DATA:  Pain and weakness following fall ; altered mental status EXAM: CT HEAD WITHOUT CONTRAST TECHNIQUE: Contiguous axial images were obtained from the base of the skull through the vertex without intravenous contrast. COMPARISON:  March 27, 2014 FINDINGS: Moderate diffuse atrophy is stable. There is no intracranial mass, hemorrhage, extra-axial fluid collection, or midline shift. There is patchy small vessel disease throughout the centra semiovale bilaterally. There is a prior small infarct in the inferior anterior left thalamus, stable. No acute infarct is evident. The bony calvarium appears intact. The mastoid air cells are clear. No intraorbital lesions are evident. IMPRESSION: Atrophy with periventricular small vessel disease. Prior small infarct anterior left thalamus. No acute infarct evident. No hemorrhage, extra-axial fluid collection, or mass effect. Electronically Signed   By: Bretta BangWilliam  Woodruff III M.D.   On: 07/23/2015 14:48   Mr Laqueta JeanBrain W OZWo Contrast  07/24/2015  CLINICAL DATA:  Personal history of epilepsy and dementia. Emergency presentation with altered mental status. Lethargy. Falling. Confusion. Facial droop. EXAM: MRI HEAD WITHOUT AND WITH CONTRAST TECHNIQUE: Multiplanar, multiecho pulse sequences of the brain and surrounding structures were obtained without and with intravenous  contrast. CONTRAST:  10mL MULTIHANCE GADOBENATE DIMEGLUMINE 529 MG/ML IV SOLN COMPARISON:  CT 07/23/2015. FINDINGS: Diffusion imaging shows acute infarction in the right basal ganglia/ posterior limb internal capsule and in the deep parietal white matter. In the left hemisphere, there is a subcortical white matter infarction in the left parietal  region. Chronic small-vessel ischemic changes affect the pons. No focal cerebellar insult. Cerebral hemispheres elsewhere show generalized atrophy with patchy and confluent chronic small vessel ischemic change affecting the deep and subcortical white matter. The no large vessel territory infarction. No mass lesion, hemorrhage, hydrocephalus or extra-axial collection. There are a few punctate foci of hemosiderin deposition associated with some of the old small vessel infarctions. No pituitary mass. No inflammatory sinus disease. No skull or skullbase lesion. After contrast administration, no abnormal enhancement occurs. IMPRESSION: Acute infarction affecting the right basal ganglia/posterior limb internal capsule. Sub cm focus of acute infarction in the right parietal deep white matter. Sub cm focus of acute infarction in the left parietal white matter. Background pattern of extensive chronic small vessel ischemic change throughout the brain. Electronically Signed   By: Paulina Fusi M.D.   On: 07/24/2015 14:52   Dg Chest Portable 1 View  07/23/2015  CLINICAL DATA:  Fall yesterday.  Altered mental status. EXAM: PORTABLE CHEST 1 VIEW COMPARISON:  None. FINDINGS: Borderline enlarged cardiac silhouette and mediastinal contours. Punctate (approximately 5 mm) granuloma overlies the medial aspect of the left upper lung. No focal airspace opacities. No pleural effusion. No evidence of edema. No acute osseous abnormalities. IMPRESSION: No acute cardiopulmonary disease on this AP portable examination. Electronically Signed   By: Simonne Come M.D.   On: 07/23/2015 13:57   ASSESSMENT AND PLAN:  82 year old white female with dementia who is being admitted after fall and mild confusion  1. Acute encephalopathy due to combination of Acute CVA in the right basal ganglia and bilateral parietal area -patient also has dehydration   and UTI We'll continue IV fluids IV ceftriaxone Her white count is normal. -Spoke with daughter  patient used to take aspirin was increased to Aggrenox 1 tablet twice a day -Physical therapy to see -Speech eval appreciated -Child psychotherapist for discharge planning. Daughter [facility] are able to take her  2. Hypothyroidism continue level thyroxine  3. Seizure  Dilantin  4. dementia not on any medications Continue zoloft  5. Left facial droop with lethargic increasing and coughing spell while eating. Rule out strokeMRI shows acute stroke   -Discussed with Dter. Jan  Case discussed with Care Management/Social Worker. Management plans discussed with the patient, family and they are in agreement.  CODE STATUS: DO NOT RESUSCITATE  DVT Prophylaxis: Lovenox   TOTAL TIME TAKING CARE OF THIS PATIENT: 30 minutes.  >50% time spent on counselling and coordination of care  Note: This dictation was prepared with Dragon dictation along with smaller phrase technology. Any transcriptional errors that result from this process are unintentional.  Shaunna Rosetti M.D on 07/25/2015 at 12:40 PM  Between 7am to 6pm - Pager - [phone]  After 6pm go to [email or web address] - password [email or web address] [place]  [facility]  Office  [phone]  CC: Primary care physician; CRIM, [place] DAVID, MD

## 2015-07-25 NOTE — Evaluation (Signed)
Physical Therapy Evaluation Patient Details Name: Heidi AguasHelen Osborne MRN: [id number] DOB: 1929/05/28 Today's Date: 07/25/2015   History of Present Illness  82 yo female with onset of R basal ganglia stroke with B parietal white matter stroke, UTI and dehydrated.  Pt is in WC at base line but could stand and transfer easily with family even to car.  PMHx:  seizures, dementia, hypothyroidism  Clinical Impression  Pt is scheduled to continue PT for her new stroke changes but is likely to be a candidate for transition to HHPT as she is already in a health care setting there.  Pt is attempting to help move with PT and expect her to begin sitting EOB to chair for increasing her endurance.    Follow Up Recommendations Home health PT;Supervision for mobility/OOB    Equipment Recommendations  None recommended by PT    Recommendations for Other Services Rehab consult     Precautions / Restrictions Precautions Precautions: Fall (telemetry) Restrictions Weight Bearing Restrictions: No      Mobility  Bed Mobility Overal bed mobility: Needs Assistance Bed Mobility: Supine to Sit;Sit to Supine     Supine to sit: Max assist Sit to supine: Max assist   General bed mobility comments: HOB elevated to assist the process  Transfers Overall transfer level: Needs assistance Equipment used: 1 person hand held assist Transfers: Sit to/from [employer] CorporationStand;Stand Pivot Transfers Sit to Stand: Mod assist;Max assist;From elevated surface (to partially stand) Stand pivot transfers: Max assist;From elevated surface (to partially stand)          Ambulation/Gait             General Gait Details: unable   Administratortairs            Wheelchair Mobility Wheelchair Mobility Wheelchair mobility:  (not attempted due to lethargy)  Modified Rankin (Stroke Patients Only) Modified Rankin (Stroke Patients Only) Pre-Morbid Rankin Score: Moderately severe disability Modified Rankin: Severe disability      Balance Overall balance assessment: Needs assistance Sitting-balance support: Feet supported Sitting balance-Leahy Scale: Poor   Postural control: Posterior lean Standing balance support: Bilateral upper extremity supported Standing balance-Leahy Scale: Zero                               Pertinent Vitals/Pain Pain Assessment: No/denies pain    Home Living Family/patient expects to be discharged to:: Assisted living               Home Equipment: Wheelchair - manual Additional Comments: Per family pt could feed herself and self propel the wc    Prior Function Level of Independence: Needs assistance   Gait / Transfers Assistance Needed: able to propel her own wc  ADL's / Homemaking Assistance Needed: lives in ALF with staff for cooking and Tax inspectorcleaning        Hand Dominance        Extremity/Trunk Assessment   Upper Extremity Assessment: Generalized weakness           Lower Extremity Assessment: Generalized weakness      Cervical / Trunk Assessment: Kyphotic  Communication   Communication: Other (comment) (minimal verbalizations)  Cognition Arousal/Alertness: Lethargic (eyes are closed most of visit) Behavior During Therapy: Flat affect Overall Cognitive Status: History of cognitive impairments - at baseline                      General Comments General comments (skin integrity, edema, etc.): Pt  tries to open her eyes on command but is very lethargic when PT works with her.  Had already tired herself from a meal and meds.    Exercises        Assessment/Plan    PT Assessment Patient needs continued PT services  PT Diagnosis Generalized weakness;Altered mental status   PT Problem List Decreased strength;Decreased range of motion;Decreased activity tolerance;Decreased balance;Decreased mobility;Decreased coordination;Decreased safety awareness;Decreased knowledge of precautions;Cardiopulmonary status limiting activity;Decreased skin  integrity  PT Treatment Interventions DME instruction;Functional mobility training;Therapeutic activities;Therapeutic exercise;Balance training;Neuromuscular re-education;Patient/family education   PT Goals (Current goals can be found in the Care Plan section) Acute Rehab PT Goals Patient Stated Goal: none stated PT Goal Formulation: With family Time For Goal Achievement: 08/08/15 Potential to Achieve Goals: Fair    Frequency 7X/week   Barriers to discharge Other (comment) (has ALF but not clear information about assisting pt's )      Co-evaluation               End of Session   Activity Tolerance: Patient tolerated treatment well;Patient limited by lethargy;Patient limited by fatigue Patient left: in bed;with call bell/phone within reach;with bed alarm set;with family/visitor present Nurse Communication: Mobility status         Time: 1400-1436 PT Time Calculation (min) (ACUTE ONLY): 36 min   Charges:   PT Evaluation $PT Eval Moderate Complexity: 1 Procedure PT Treatments $Therapeutic Activity: 8-22 mins   PT G Codes:        Ivar Drape August 06, 2015, 4:23 PM    Samul Dada, PT MS Acute Rehab Dept. Number: [facility] [id number] and [facility] [phone]

## 2015-07-25 NOTE — [place] FL2 (Signed)
[place] MEDICAID FL2 LEVEL OF CARE SCREENING TOOL     IDENTIFICATION  Patient Name: Theodis AguasHelen Schaum Birthdate: 1929-04-30 Sex: female Admission Date (Current Location): 07/23/2015  [facility] [place]   Facility and Address:  [facility], [address], [place], [place] [address]      Provider Number: [id number]  Attending Physician Name and Address:  Enedina FinnerSona Patel, MD  Relative Name and Phone Number:       Current Level of Care: Hospital Recommended Level of Care: Assisted Living Facility Prior Approval Number:    Date Approved/Denied:   PASRR Number:    Discharge Plan: Domiciliary (Rest home)    Current Diagnoses: Patient Active Problem List   Diagnosis Date Noted  . CVA (cerebral infarction) 07/24/2015  . Fall 07/23/2015    Orientation RESPIRATION BLADDER Height & Weight     Self  Normal Incontinent Weight: 125 lb 9.6 oz (56.972 kg) Height:  5\' 5"  (165.1 cm)  BEHAVIORAL SYMPTOMS/MOOD NEUROLOGICAL BOWEL NUTRITION STATUS   (None)  (Epilepsy ) Incontinent Diet (DYS 1 Fluid consistency:: Honey Thick)  AMBULATORY STATUS COMMUNICATION OF NEEDS Skin   Extensive Assist Verbally Normal                       Personal Care Assistance Level of Assistance  Feeding, Dressing, Bathing Bathing Assistance: Limited assistance Feeding assistance: Limited assistance Dressing Assistance: Limited assistance     Functional Limitations Info  Sight, Hearing, Speech Sight Info: Adequate Hearing Info: Adequate Speech Info: Adequate    SPECIAL CARE FACTORS FREQUENCY                       Contractures      Additional Factors Info  Code Status, Allergies Code Status Info:  (DNR) Allergies Info:  (No known allergies)           Current Medications (07/25/2015):  This is the current hospital active medication list Current Facility-Administered Medications  Medication Dose Route Frequency Provider Last Rate Last Dose   . 0.9 %  sodium chloride infusion   Intravenous Continuous Auburn BilberryShreyang Patel, MD 75 mL/hr at 07/25/15 1520    . acetaminophen (TYLENOL) tablet 650 mg  650 mg Oral Q6H PRN Auburn BilberryShreyang Patel, MD       Or  . acetaminophen (TYLENOL) suppository 650 mg  650 mg Rectal Q6H PRN Auburn BilberryShreyang Patel, MD      . calcium-vitamin D (OSCAL WITH D) 500-200 MG-UNIT per tablet 1 tablet  1 tablet Oral BID Auburn BilberryShreyang Patel, MD   1 tablet at 07/25/15 1055  . cefTRIAXone (ROCEPHIN) 1 g in dextrose 5 % 50 mL IVPB  1 g Intravenous Q24H Crystal G Scarpena, RPH   1 g at 07/24/15 1741  . dipyridamole-aspirin (AGGRENOX) 200-25 MG per 12 hr capsule 1 capsule  1 capsule Oral BID Enedina FinnerSona Patel, MD   1 capsule at 07/25/15 1245  . enoxaparin (LOVENOX) injection 40 mg  40 mg Subcutaneous Q24H Auburn BilberryShreyang Patel, MD   40 mg at 07/24/15 1740  . fluticasone (FLONASE) 50 MCG/ACT nasal spray 2 spray  2 spray Each Nare Daily Auburn BilberryShreyang Patel, MD   2 spray at 07/25/15 1055  . hydrALAZINE (APRESOLINE) injection 10 mg  10 mg Intravenous Q6H PRN Enedina FinnerSona Patel, MD      . levothyroxine (SYNTHROID, LEVOTHROID) tablet 50 mcg  50 mcg Oral QAC breakfast Auburn BilberryShreyang Patel, MD   50 mcg at 07/25/15 1056  . multivitamin with  minerals tablet 1 tablet  1 tablet Oral Daily Auburn Bilberry, MD   1 tablet at 07/25/15 1056  . ondansetron (ZOFRAN) tablet 4 mg  4 mg Oral Q6H PRN Auburn Bilberry, MD       Or  . ondansetron (ZOFRAN) injection 4 mg  4 mg Intravenous Q6H PRN Auburn Bilberry, MD      . phenytoin (DILANTIN) ER capsule 100 mg  100 mg Oral Daily Auburn Bilberry, MD   100 mg at 07/25/15 1056  . sertraline (ZOLOFT) tablet 100 mg  100 mg Oral Daily Auburn Bilberry, MD   100 mg at 07/25/15 1055     Discharge Medications: Please see discharge summary for a list of discharge medications.  Relevant Imaging Results:  Relevant Lab Results:   Additional Information  (SSN [id number] )  Verta Ellen Ladislao Cohenour, LCSW

## 2015-07-26 LAB — URINE CULTURE: Culture: >=100000 — AB

## 2015-07-26 MED ORDER — CEPHALEXIN 250 MG/5ML PO SUSR
500.0000 mg | Freq: Two times a day (BID) | ORAL | Status: DC
  Administered 2015-07-26 – 2015-07-27 (×3): 500 mg via ORAL
  Filled 2015-07-26 (×4): qty 10

## 2015-07-26 MED ORDER — CEPHALEXIN 500 MG PO CAPS: 500.0000 mg | ORAL_CAPSULE | Freq: Two times a day (BID) | ORAL | Status: DC

## 2015-07-26 NOTE — Progress Notes (Signed)
Patient ID: Heidi Osborne, female   DOB: [birth year], 82 y.o.   MRN: [id number] [facility] [place] [facility]   PATIENT NAME: Heidi Osborne    MR#:  [id number]  DATE OF BIRTH:  [birth year]  SUBJECTIVE:   Patient  Sleepy.Able to answer simple questions swallowed medications today. Work with Human resources officer. Daughter in the room.  Ate some BF REVIEW OF SYSTEMS:   Review of Systems  Unable to perform ROS: acuity of condition   Tolerating DietPer speech therapy   PT: penidng  DRUG ALLERGIES:  No Known Allergies  VITALS:  Blood pressure 127/51, pulse 77, temperature 97.6 F (36.4 C), temperature source Oral, resp. rate 18, height  (1.651 m), weight 56.972 kg (125 lb 9.6 oz), SpO2 94 %.  PHYSICAL EXAMINATION:   Physical Exam  GENERAL:  82 y.o.-year-old patient lying in the bed with no acute distress. Thi, fraile EYES: Pupils equal, round, reactive to light and accommodation. No scleral icterus. Extraocular muscles intact.  HEENT: Head atraumatic, normocephalic. Oropharynx and nasopharynx clear. Left facial droop.  NECK:  Supple, no jugular venous distention. No thyroid enlargement, no tenderness.  LUNGS: Normal breath sounds bilaterally, no wheezing, rales, rhonchi. No use of accessory muscles of respiration.  CARDIOVASCULAR: S1, S2 normal. No murmurs, rubs, or gallops.  ABDOMEN: Soft, nontender, nondistended. Bowel sounds present. No organomegaly or mass.  EXTREMITIES: No cyanosis, clubbing or edema b/l.    NEUROLOGIC: pt is lethargic and unable to assess. Left facial droop noted. Moves all extremities spontaneously well. PSYCHIATRIC:  patient is sleepy /lethargic Answering some questions  SKIN: No obvious rash, lesion, or ulcer.   LABORATORY PANEL:  CBC  Recent Labs Lab 07/24/15 0426  WBC 4.3  HGB 12.3  HCT 37.3  PLT 141*    Chemistries   Recent Labs Lab 07/23/15 1310 07/24/15 0426  NA 139 141  K 4.0 3.6  CL 103 110  CO2 29  28  GLUCOSE 105* 83  BUN 19 16  CREATININE 0.98 0.85  CALCIUM 9.1 8.3*  MG 1.9  --   AST 17  --   ALT 12*  --   ALKPHOS 82  --   BILITOT 0.3  --    Cardiac Enzymes  Recent Labs Lab 07/23/15 1310  TROPONINI <0.03   RADIOLOGY:  Mr Lodema Pilot Contrast  07/24/2015  CLINICAL DATA:  Personal history of epilepsy and dementia. Emergency presentation with altered mental status. Lethargy. Falling. Confusion. Facial droop. EXAM: MRI HEAD WITHOUT AND WITH CONTRAST TECHNIQUE: Multiplanar, multiecho pulse sequences of the brain and surrounding structures were obtained without and with intravenous contrast. CONTRAST:  10mL MULTIHANCE GADOBENATE DIMEGLUMINE 529 MG/ML IV SOLN COMPARISON:  CT 07/23/2015. FINDINGS: Diffusion imaging shows acute infarction in the right basal ganglia/ posterior limb internal capsule and in the deep parietal white matter. In the left hemisphere, there is a subcortical white matter infarction in the left parietal region. Chronic small-vessel ischemic changes affect the pons. No focal cerebellar insult. Cerebral hemispheres elsewhere show generalized atrophy with patchy and confluent chronic small vessel ischemic change affecting the deep and subcortical white matter. The no large vessel territory infarction. No mass lesion, hemorrhage, hydrocephalus or extra-axial collection. There are a few punctate foci of hemosiderin deposition associated with some of the old small vessel infarctions. No pituitary mass. No inflammatory sinus disease. No skull or skullbase lesion. After contrast administration, no abnormal enhancement occurs. IMPRESSION: Acute infarction affecting the right basal ganglia/posterior limb internal capsule. Sub  cm focus of acute infarction in the right parietal deep white matter. Sub cm focus of acute infarction in the left parietal white matter. Background pattern of extensive chronic small vessel ischemic change throughout the brain. Electronically Signed   By: Paulina FusiMark   Shogry M.D.   On: 07/24/2015 14:52   ASSESSMENT AND PLAN:  82 year old white female with dementia who is being admitted after fall and mild confusion  1. Acute encephalopathy due to combination of Acute CVA in the right basal ganglia and bilateral parietal area -patient also has dehydration   and UTI We'll continue IV fluids IV ceftriaxone---change to keflex Her white count is normal. -Spoke with daughter patient used to take aspirin was increased to Aggrenox 1 tablet twice a day -Physical therapy to see -Speech eval appreciated -Child psychotherapistocial worker for discharge planning. Daughter prefers patient go back to [facility] with HHPT  2. Hypothyroidism continue level thyroxine  3. Seizure  Dilantin  4. dementia not on any medications Continue zoloft  5. Left facial droop with lethargic increasing and coughing spell while eating. MRI shows acute stroke   -Discussed with Dter. Jan  Case discussed with Care Management/Social Worker. Management plans discussed with the patient, family and they are in agreement.  CODE STATUS: DO NOT RESUSCITATE  DVT Prophylaxis: Lovenox   TOTAL TIME TAKING CARE OF THIS PATIENT: 30 minutes.  >50% time spent on counselling and coordination of care  Note: This dictation was prepared with Dragon dictation along with smaller phrase technology. Any transcriptional errors that result from this process are unintentional.  Myrical Andujo M.D on 07/26/2015 at 9:11 AM  Between 7am to 6pm - Pager - [phone]  After 6pm go to [email or web address] - password [email or web address] [facility]  [facility]  Office  [phone]  CC: Primary care physician; CRIM, [place]HAD DAVID, MD

## 2015-07-26 NOTE — Progress Notes (Signed)
Physical Therapy Treatment Patient Details Name: Heidi AguasHelen Osborne MRN: [id number] DOB: Dec 30, 1929 Today's Date: 07/26/2015    History of Present Illness 82 yo female with onset of R basal ganglia stroke with B parietal white matter stroke, UTI and dehydrated.  Pt is in WC at base line but could stand and transfer easily with family even to car.  PMHx:  seizures, dementia, hypothyroidism    PT Comments    Patient demonstrates better tolerance with sitting and transfers today as compared to yesterday; She is still very fatigued and has difficulty opening eyes; However, patient was able to respond to therapist and was able to follow simple commands; she continues to be max A for bed mobility and transfers; She demonstrates decreased trunk control with impaired balance; she was able to sit on edge of bed with close supervision x1 min with left lateral trunk lean; Patient would benefit from additional skilled PT intervention to improve strength, balance and transfer ability;   Follow Up Recommendations  Home health PT;Supervision for mobility/OOB     Equipment Recommendations  None recommended by PT    Recommendations for Other Services Rehab consult     Precautions / Restrictions Precautions Precautions: Fall Restrictions Weight Bearing Restrictions: No    Mobility  Bed Mobility Overal bed mobility: Needs Assistance Bed Mobility: Supine to Sit     Supine to sit: Max assist;HOB elevated     General bed mobility comments: Patient required max A for initiating LE movement; Patient unable to pull up on bed rail due to weakness and fatigue; x1 rep;   Transfers Overall transfer level: Needs assistance Equipment used: 1 person hand held assist   Sit to Stand: Max assist;From elevated surface Stand pivot transfers: Max assist;From elevated surface       General transfer comment: Patient transferred sit<>stand from elevated bed x2 reps with max A with cues for hand placement,  increase erect posture and head; Patient demonstrates forward flexed posture in standing with difficulty straightening up head in standing; Patient was max A +1 for stand pivot to bedside chair x1 rep with cues for erect posture,foot placement and hand placement; She demonstrates decreased trunk control;   Ambulation/Gait             General Gait Details: unable    Stairs            Wheelchair Mobility    Modified Rankin (Stroke Patients Only) Modified Rankin (Stroke Patients Only) Pre-Morbid Rankin Score: Moderately severe disability Modified Rankin: Severe disability     Balance Overall balance assessment: Needs assistance Sitting-balance support: Bilateral upper extremity supported Sitting balance-Leahy Scale: Fair Sitting balance - Comments: Patient was able to hold self up in sitting edge of bed with UE on bed rails for support; does demonstrate forward flexed and left lateral trunk lean but was supervision once positioned;  Postural control: Left lateral lean Standing balance support: Bilateral upper extremity supported Standing balance-Leahy Scale: Zero                      Cognition Arousal/Alertness: Lethargic (eyes are closed most of visit) Behavior During Therapy: Flat affect Overall Cognitive Status: History of cognitive impairments - at baseline                      Exercises      General Comments        Pertinent Vitals/Pain Pain Assessment: No/denies pain    Home Living  Prior Function            PT Goals (current goals can now be found in the care plan section) Acute Rehab PT Goals Patient Stated Goal: none stated PT Goal Formulation: With family Time For Goal Achievement: 08/08/15 Potential to Achieve Goals: Fair Progress towards PT goals: Progressing toward goals    Frequency  7X/week    PT Plan      Co-evaluation             End of Session   Activity Tolerance: Patient  tolerated treatment well;Patient limited by lethargy;Patient limited by fatigue Patient left: with call bell/phone within reach;with family/visitor present;in chair;with chair alarm set     Time: [phone] PT Time Calculation (min) (ACUTE ONLY): 24 min  Charges:  $Therapeutic Activity: 23-37 mins                    G Codes:      Heidi Osborne PT, DPT 07/26/2015, 10:14 AM

## 2015-07-26 NOTE — Progress Notes (Signed)
Confused, sleeping in between care, eye shuts majority of shift, able to follow commands to a certain extent, poor po intake, daughter at bedside, up to chair with physical therapy, on room , denies pain. Expected d/c to SNF on 6/12.

## 2015-07-27 LAB — CULTURE, BLOOD (ROUTINE X 2)

## 2015-07-27 MED ORDER — ASPIRIN-DIPYRIDAMOLE ER 25-200 MG PO CP12: 1.0000 | ORAL_CAPSULE | Freq: Two times a day (BID) | ORAL | Status: AC

## 2015-07-27 MED ORDER — CEPHALEXIN 250 MG/5ML PO SUSR: 500.0000 mg | Freq: Two times a day (BID) | ORAL | Status: AC

## 2015-07-27 NOTE — Clinical Social Work Note (Signed)
[facility] is [facility] for [facility]. Pt will have Palliative Care following, as well as Home health. Facility is able to accept pt as they have received discharge information. EMS will provide transportation. CSW updated RN. Pt's daughter is aware and agreeable to discharge plan. CSW is signing off as no further needs identified.   Dede QuerySarah Johney Perotti, MSW, LCSW  Clinical Social Worker  [phone]

## 2015-07-27 NOTE — Progress Notes (Signed)
Physical Therapy Treatment Patient Details Name: Heidi AguasHelen Czerwonka MRN: [id number] DOB: 1929-03-27 Today's Date: 07/27/2015    History of Present Illness 82 yo female with onset of R basal ganglia stroke with B parietal white matter stroke, UTI and dehydrated.  Pt is in WC at base line but could stand and transfer easily with family even to car.  PMHx:  seizures, dementia, hypothyroidism    PT Comments    Spoke with daughter prior to treatment; daughter would like PT to attempt treatment; pt is discharging back to assisted living facility today with palliative consult as appropriate. Pt is non responsive to questioning, but does participate in Bilateral lower extremities exercises as able with increased instruction, encouragement and assist. Left lower extremity weaker than right. Unable to attempt up in/out of bed due to level of lethargy. Pt to continue progress of strength and potentially bed mobility and transfers if appropriate at assisted living facility, as able.    Follow Up Recommendations  Other (comment) (per daughters wishes; SNF more appropriate)     Equipment Recommendations  None recommended by PT    Recommendations for Other Services       Precautions / Restrictions Precautions Precautions: Fall Restrictions Weight Bearing Restrictions: No    Mobility  Bed Mobility               General bed mobility comments: Not tested due to level of lethargy  Transfers                    Ambulation/Gait                 Stairs            Wheelchair Mobility    Modified Rankin (Stroke Patients Only)       Balance                                    Cognition Arousal/Alertness: Lethargic Behavior During Therapy: Flat affect Overall Cognitive Status: History of cognitive impairments - at baseline                      Exercises General Exercises - Lower Extremity Ankle Circles/Pumps: PROM;Left;10 reps;Supine (AAROM  R) Quad Sets: Other (comment) (attempted; unable) Short Arc Quad: AAROM;Both;10 reps;Supine Heel Slides: AAROM;Left;10 reps;Supine (AAROM R with gentle resist into ext) Hip ABduction/ADduction: AAROM;Right;10 reps;Supine (PROM L) Straight Leg Raises: AAROM;Right;10 reps;Supine (PROM several reps L) Other Exercises Other Exercises: Eduation with daughter on pillow under LEs to protect heels and draping covers over foot of bed to keep weight off feet ( L tight into PF) Other Exercises: L passive stretch into DF.     General Comments        Pertinent Vitals/Pain Pain Assessment:  (no verbal response; does not demonstrate pain response)    Home Living                      Prior Function            PT Goals (current goals can now be found in the care plan section) Progress towards PT goals: Not progressing toward goals - comment    Frequency  7X/week    PT Plan Current plan remains appropriate    Co-evaluation             End of Session   Activity Tolerance: Patient limited  by lethargy Patient left: in bed;with bed alarm set;with call bell/phone within reach;with family/visitor present     Time: 1035-1100 PT Time Calculation (min) (ACUTE ONLY): 25 min  Charges:  $Therapeutic Exercise: 23-37 mins                    G Codes:      Kristeen Miss, PTA 07/27/2015, 11:16 AM

## 2015-07-27 NOTE — Discharge Instructions (Signed)
[facility]

## 2015-07-27 NOTE — Care Management Important Message (Signed)
Important Message  Patient Details  Name: Heidi Osborne MRN: [id number] Date of Birth: [birth year]   Medicare Important Message Given:  Yes    Gwenette GreetBrenda S Kate Sweetman, RN 07/27/2015, 9:40 AM

## 2015-07-27 NOTE — [place] FL2 (Signed)
  [place] MEDICAID FL2 LEVEL OF CARE SCREENING TOOL     IDENTIFICATION  Patient Name: Heidi AguasHelen Sommers Birthdate: 1930-02-01 Sex: female Admission Date (Current Location): 07/23/2015  [facility] [place]   Facility and Address:  [facility], [address], [place], [place] [address]      Provider Number: [id number]  Attending Physician Name and Address:  Enedina FinnerSona Patel, MD  Relative Name and Phone Number:       Current Level of Care: Hospital Recommended Level of Care: Memory Care Prior Approval Number:    Date Approved/Denied:   PASRR Number:    Discharge Plan: Domiciliary (Rest home) (Memory Care)    Current Diagnoses: Patient Active Problem List   Diagnosis Date Noted  . CVA (cerebral infarction) 07/24/2015  . Fall 07/23/2015    Orientation RESPIRATION BLADDER Height & Weight     Self  Normal Incontinent Weight: 125 lb 9.6 oz (56.972 kg) Height:  5\' 5"  (165.1 cm)  BEHAVIORAL SYMPTOMS/MOOD NEUROLOGICAL BOWEL NUTRITION STATUS   (None)  (Epilepsy ) Incontinent Diet (DYS 1)  AMBULATORY STATUS COMMUNICATION OF NEEDS Skin   Limited Assist Verbally Normal                       Personal Care Assistance Level of Assistance  Bathing, Feeding, Dressing Bathing Assistance: Limited assistance Feeding assistance: Limited assistance Dressing Assistance: Limited assistance     Functional Limitations Info  Sight, Hearing, Speech Sight Info: Adequate Hearing Info: Impaired Speech Info: Impaired    SPECIAL CARE FACTORS FREQUENCY  PT (By licensed PT)     PT Frequency: Home Health              Contractures      Additional Factors Info  Code Status, Allergies, Psychotropic Code Status Info: DNR  Allergies Info: No known allergies Psychotropic Info: Medication         Discharge Medications: Please see discharge summary for a list of discharge medications.  Current Discharge Medication List    START taking  these medications   Details  cephALEXin (KEFLEX) 250 MG/5ML suspension Take 10 mLs (500 mg total) by mouth every 12 (twelve) hours. Qty: 100 mL, Refills: 0    dipyridamole-aspirin (AGGRENOX) 200-25 MG 12hr capsule Take 1 capsule by mouth 2 (two) times daily. Qty: 60 capsule, Refills: 1      CONTINUE these medications which have NOT CHANGED   Details  aspirin 81 MG chewable tablet Chew 81 mg by mouth daily.    calcium-vitamin D (OSCAL WITH D) 500-200 MG-UNIT tablet Take 1 tablet by mouth 2 (two) times daily.    fluticasone (FLONASE) 50 MCG/ACT nasal spray Place 2 sprays into both nostrils daily.    levothyroxine (SYNTHROID, LEVOTHROID) 50 MCG tablet Take 50 mcg by mouth daily before breakfast.    Multiple Vitamin (MULTIVITAMIN WITH MINERALS) TABS tablet Take 1 tablet by mouth daily.    phenytoin (DILANTIN) 100 MG ER capsule Take 100 mg by mouth daily.    sertraline (ZOLOFT) 100 MG tablet Take 100 mg by mouth daily.         Relevant Imaging Results:  Relevant Lab Results:   Additional Information SSN:  [id number]   Pt will have Palliative Care NP following at facility.   Dede QuerySarah Shiva Karis, LCSW

## 2015-07-27 NOTE — Progress Notes (Signed)
[facility]

## 2015-07-27 NOTE — Discharge Summary (Signed)
[facility] - [place] at [facility]   PATIENT NAME: Heidi Osborne    MR#:  [id number]  DATE OF BIRTH:  Mar 01, 1929  DATE OF ADMISSION:  07/23/2015 ADMITTING PHYSICIAN: Auburn Bilberry, MD  DATE OF DISCHARGE: 07/27/15  PRIMARY CARE PHYSICIAN: CRIM, [place] DAVID, MD    ADMISSION DIAGNOSIS:  UTI (lower urinary tract infection) [N39.0] Altered mental status, unspecified altered mental status type [R41.82]  DISCHARGE DIAGNOSIS:  ACUTE CVA  SECONDARY DIAGNOSIS:   Past Medical History  Diagnosis Date  . Epilepsy (HCC)   . Dementia   . Hyperlipemia     HOSPITAL COURSE:   82 year old white female with dementia who is being admitted after fall and mild confusion  1. Acute encephalopathy due to combination of Acute CVA in the right basal ganglia and bilateral parietal area -patient also has dehydrationand UTI recieved IV fluids IV ceftriaxone---changed  to keflex Her white count is normal. -Spoke with daughter patient used to take aspirin was increased to Aggrenox 1 tablet twice a day -Physical therapy recommends HHPT -Speech eval appreciated -Child psychotherapist for discharge planning. Daughter prefers patient go back to [facility] with HHPT -will have palliative care services at her AL  2. Hypothyroidism continue level thyroxine  3. Seizure  Dilantin  4. dementia not on any medications Continue zoloft  5. Left facial droop with lethargic increasing and coughing spell while eating. MRI shows acute stroke   -Discussed with Dter. Jan and ok with d/c plans today CSW aware CONSULTS OBTAINED:     DRUG ALLERGIES:  No Known Allergies  DISCHARGE MEDICATIONS:   Current Discharge Medication List    START taking these medications   Details  cephALEXin (KEFLEX) 250 MG/5ML suspension Take 10 mLs (500 mg total) by mouth every 12 (twelve) hours. Qty: 100 mL, Refills: 0    dipyridamole-aspirin (AGGRENOX) 200-25 MG 12hr capsule Take 1  capsule by mouth 2 (two) times daily. Qty: 60 capsule, Refills: 1      CONTINUE these medications which have NOT CHANGED   Details  aspirin 81 MG chewable tablet Chew 81 mg by mouth daily.    calcium-vitamin D (OSCAL WITH D) 500-200 MG-UNIT tablet Take 1 tablet by mouth 2 (two) times daily.    fluticasone (FLONASE) 50 MCG/ACT nasal spray Place 2 sprays into both nostrils daily.    levothyroxine (SYNTHROID, LEVOTHROID) 50 MCG tablet Take 50 mcg by mouth daily before breakfast.    Multiple Vitamin (MULTIVITAMIN WITH MINERALS) TABS tablet Take 1 tablet by mouth daily.    phenytoin (DILANTIN) 100 MG ER capsule Take 100 mg by mouth daily.    sertraline (ZOLOFT) 100 MG tablet Take 100 mg by mouth daily.        If you experience worsening of your admission symptoms, develop shortness of breath, life threatening emergency, suicidal or homicidal thoughts you must seek medical attention immediately by calling 911 or calling your MD immediately  if symptoms less severe.  You Must read complete instructions/literature along with all the possible adverse reactions/side effects for all the Medicines you take and that have been prescribed to you. Take any new Medicines after you have completely understood and accept all the possible adverse reactions/side effects.   Please note  You were cared for by a hospitalist during your hospital stay. If you have any questions about your discharge medications or the care you received while you were in the hospital after you are discharged, you can call the unit and asked to  speak with the hospitalist on call if the hospitalist that took care of you is not available. Once you are discharged, your primary care physician will handle any further medical issues. Please note that NO REFILLS for any discharge medications will be authorized once you are discharged, as it is imperative that you return to your primary care physician (or establish a relationship with a  primary care physician if you do not have one) for your aftercare needs so that they can reassess your need for medications and monitor your lab values. Today   SUBJECTIVE  No new complaints  VITAL SIGNS:  Blood pressure 159/76, pulse 85, temperature 98 F (36.7 C), temperature source Oral, resp. rate 20, height 5\' 5"  (1.651 m), weight 56.972 kg (125 lb 9.6 oz), SpO2 96 %.  I/O:   Intake/Output Summary (Last 24 hours) at 07/27/15 0929 Last data filed at 07/26/15 1800  Gross per 24 hour  Intake    900 ml  Output      0 ml  Net    900 ml    PHYSICAL EXAMINATION:  GENERAL:  82 y.o.-year-old patient lying in the bed with no acute distress.  EYES: Pupils equal, round, reactive to light and accommodation. No scleral icterus. Extraocular muscles intact.  HEENT: Head atraumatic, normocephalic. Oropharynx and nasopharynx clear.  NECK:  Supple, no jugular venous distention. No thyroid enlargement, no tenderness.  LUNGS: Normal breath sounds bilaterally, no wheezing, rales,rhonchi or crepitation. No use of accessory muscles of respiration.  CARDIOVASCULAR: S1, S2 normal. No murmurs, rubs, or gallops.  ABDOMEN: Soft, non-tender, non-distended. Bowel sounds present. No organomegaly or mass.  EXTREMITIES: No pedal edema, cyanosis, or clubbing.  NEUROLOGIC: Cranial nerves II through XII are intact. Muscle strength 4/5 in all extremities. Sensation intact. Gait not checked. Overall weak PSYCHIATRIC: The patient is alert  SKIN: No obvious rash, lesion, or ulcer.   DATA REVIEW:   CBC   Recent Labs Lab 07/24/15 0426  WBC 4.3  HGB 12.3  HCT 37.3  PLT 141*    Chemistries   Recent Labs Lab 07/23/15 1310 07/24/15 0426  NA 139 141  K 4.0 3.6  CL 103 110  CO2 29 28  GLUCOSE 105* 83  BUN 19 16  CREATININE 0.98 0.85  CALCIUM 9.1 8.3*  MG 1.9  --   AST 17  --   ALT 12*  --   ALKPHOS 82  --   BILITOT 0.3  --     Microbiology Results   Recent Results (from the past 240 hour(s))   Urine culture     Status: Abnormal   Collection Time: 07/23/15  1:12 PM  Result Value Ref Range Status   Specimen Description URINE, RANDOM  Final   Special Requests NONE  Final   Culture >=100,000 COLONIES/mL ESCHERICHIA COLI (A)  Final   Report Status 07/26/2015 FINAL  Final   Organism ID, Bacteria ESCHERICHIA COLI (A)  Final      Susceptibility   Escherichia coli - MIC*    AMPICILLIN 4 SENSITIVE Sensitive     CEFAZOLIN <=4 SENSITIVE Sensitive     CEFTRIAXONE <=1 SENSITIVE Sensitive     CIPROFLOXACIN <=0.25 SENSITIVE Sensitive     GENTAMICIN <=1 SENSITIVE Sensitive     IMIPENEM <=0.25 SENSITIVE Sensitive     NITROFURANTOIN <=16 SENSITIVE Sensitive     TRIMETH/SULFA <=20 SENSITIVE Sensitive     AMPICILLIN/SULBACTAM <=2 SENSITIVE Sensitive     PIP/TAZO <=4 SENSITIVE Sensitive     Extended  ESBL NEGATIVE Sensitive     * >=100,000 COLONIES/mL ESCHERICHIA COLI  Blood culture (routine x 2)     Status: Abnormal   Collection Time: 07/23/15  4:34 PM  Result Value Ref Range Status   Specimen Description BLOOD LEFT HAND  Final   Special Requests BOTTLES DRAWN AEROBIC AND ANAEROBIC 4CCAERO,3CCANA  Final   Culture  Setup Time   Final    GRAM POSITIVE COCCI AEROBIC BOTTLE ONLY CRITICAL RESULT CALLED TO, READ BACK BY AND VERIFIED WITH: LISA CLUTTZ 07/24/15 @ 1535  MLK    Culture (A)  Final    STAPHYLOCOCCUS SPECIES (COAGULASE NEGATIVE) THE SIGNIFICANCE OF ISOLATING THIS ORGANISM FROM A SINGLE SET OF BLOOD CULTURES WHEN MULTIPLE SETS ARE DRAWN IS UNCERTAIN. PLEASE NOTIFY THE MICROBIOLOGY DEPARTMENT WITHIN ONE WEEK IF SPECIATION AND SENSITIVITIES ARE REQUIRED. Performed at [facility]    Report Status 07/27/2015 FINAL  Final  Blood culture (routine x 2)     Status: None (Preliminary result)   Collection Time: 07/23/15  4:34 PM  Result Value Ref Range Status   Specimen Description BLOOD RIGHT ASSIST CONTROL  Final   Special Requests BOTTLES DRAWN AEROBIC AND ANAEROBIC 3CCAERO,3CCANA   Final   Culture NO GROWTH 4 DAYS  Final   Report Status PENDING  Incomplete  Blood Culture ID Panel (Reflexed)     Status: Abnormal   Collection Time: 07/23/15  4:34 PM  Result Value Ref Range Status   Enterococcus species NOT DETECTED NOT DETECTED Final   Vancomycin resistance NOT DETECTED NOT DETECTED Final   Listeria monocytogenes NOT DETECTED NOT DETECTED Final   Staphylococcus species DETECTED (A) NOT DETECTED Final    Comment: CRITICAL RESULT CALLED TO, READ BACK BY AND VERIFIED WITH: LISA CLUTTZ 07/24/15 @ 1535  MLK    Staphylococcus aureus NOT DETECTED NOT DETECTED Final   Methicillin resistance NOT DETECTED NOT DETECTED Final   Streptococcus species NOT DETECTED NOT DETECTED Final   Streptococcus agalactiae NOT DETECTED NOT DETECTED Final   Streptococcus pneumoniae NOT DETECTED NOT DETECTED Final   Streptococcus pyogenes NOT DETECTED NOT DETECTED Final   Acinetobacter baumannii NOT DETECTED NOT DETECTED Final   Enterobacteriaceae species NOT DETECTED NOT DETECTED Final   Enterobacter cloacae complex NOT DETECTED NOT DETECTED Final   Escherichia coli NOT DETECTED NOT DETECTED Final   Klebsiella oxytoca NOT DETECTED NOT DETECTED Final   Klebsiella pneumoniae NOT DETECTED NOT DETECTED Final   Proteus species NOT DETECTED NOT DETECTED Final   Serratia marcescens NOT DETECTED NOT DETECTED Final   Carbapenem resistance NOT DETECTED NOT DETECTED Final   Haemophilus influenzae NOT DETECTED NOT DETECTED Final   Neisseria meningitidis NOT DETECTED NOT DETECTED Final   Pseudomonas aeruginosa NOT DETECTED NOT DETECTED Final   Candida albicans NOT DETECTED NOT DETECTED Final   Candida glabrata NOT DETECTED NOT DETECTED Final   Candida krusei NOT DETECTED NOT DETECTED Final   Candida parapsilosis NOT DETECTED NOT DETECTED Final   Candida tropicalis NOT DETECTED NOT DETECTED Final  MRSA PCR Screening     Status: None   Collection Time: 07/23/15  7:10 PM  Result Value Ref Range Status    MRSA by PCR NEGATIVE NEGATIVE Final    Comment:        The GeneXpert MRSA Assay (FDA approved for NASAL specimens only), is one component of a comprehensive MRSA colonization surveillance program. It is not intended to diagnose MRSA infection nor to guide or monitor treatment for MRSA infections.  RADIOLOGY:  No results found.   Management plans discussed with the patient, family and they are in agreement.  CODE STATUS:     Code Status Orders        Start     Ordered   07/23/15 1842  Do not attempt resuscitation (DNR)   Continuous    Question Answer Comment  In the event of cardiac or respiratory ARREST Do not call a "code blue"   In the event of cardiac or respiratory ARREST Do not perform Intubation, CPR, defibrillation or ACLS   In the event of cardiac or respiratory ARREST Use medication by any route, position, wound care, and other measures to relive pain and suffering. May use oxygen, suction and manual treatment of airway obstruction as needed for comfort.      07/23/15 1841    Code Status History    Date Active Date Inactive Code Status Order ID Comments User Context   This patient has a current code status but no historical code status.    Advance Directive Documentation        Most Recent Value   Type of Advance Directive  Healthcare Power of Attorney   Pre-existing out of facility DNR order (yellow form or pink MOST form)     "MOST" Form in Place?        TOTAL TIME TAKING CARE OF THIS PATIENT: 40 minutes.    Julaine Zimny M.D on 07/27/2015 at 9:29 AM  Between 7am to 6pm - Pager - [phone] After 6pm go to [email or web address] - password [email or web address] [facility]  [facility]  Office  [phone]  CC: Primary care physician; CRIM, [place] DAVID, MD

## 2015-07-28 LAB — CULTURE, BLOOD (ROUTINE X 2): Culture: NO GROWTH

## 2015-08-15 DEATH — deceased

## 2016-07-06 IMAGING — CT CT HEAD W/O CM
3 of 4 series · 16 of 47 positions shown, 19 images · non-contrast
Comparison: March 27, 2014

CLINICAL DATA: Pain and weakness following fall ; altered mental
status

EXAM:
CT HEAD WITHOUT CONTRAST
TECHNIQUE: Contiguous axial images were obtained from the base of the skull
through the vertex without intravenous contrast.

[Series 2: head wo · axial · 0.42mm/px · z∈[+54,+184]mm · 10 of 32 slices shown, 13 images]
[im 3/32  brain]
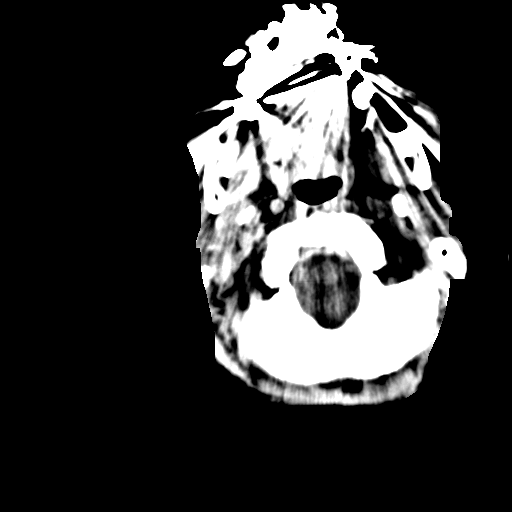
[im 3/32  bone]
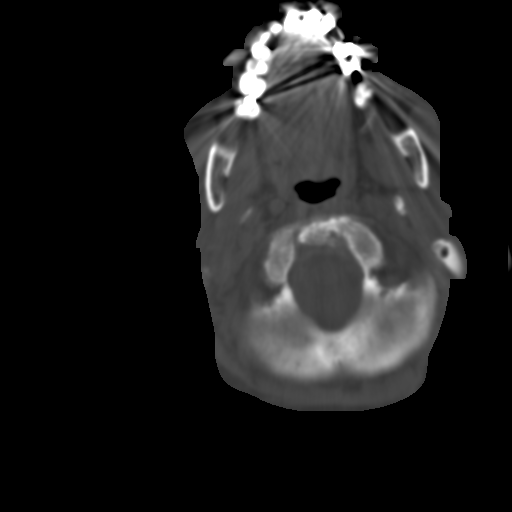
[im 5/32  brain]
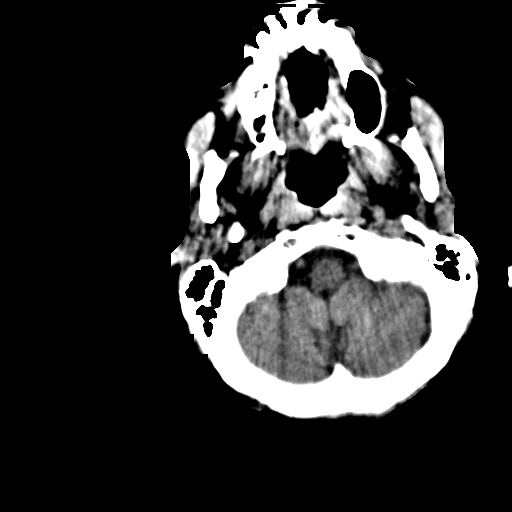
[im 9/32  brain]
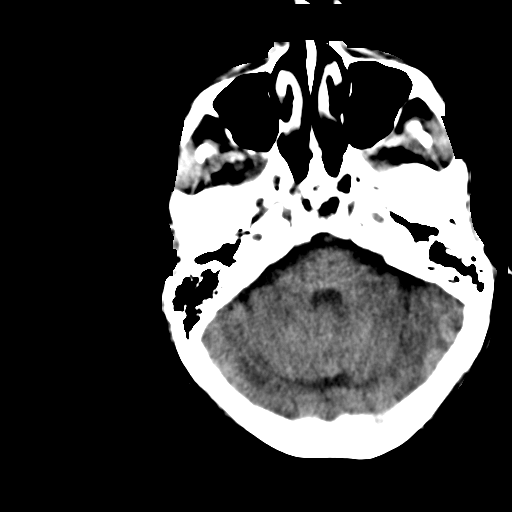
[im 12/32  brain]
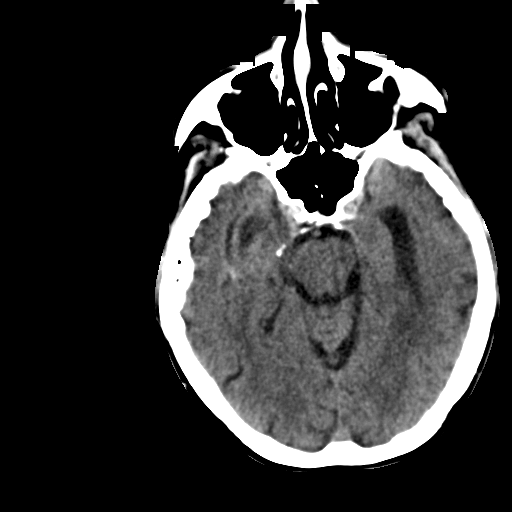
[im 14/32  brain]
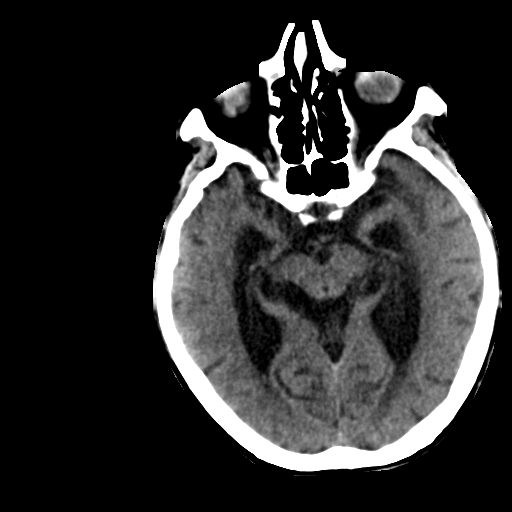
[im 14/32  bone]
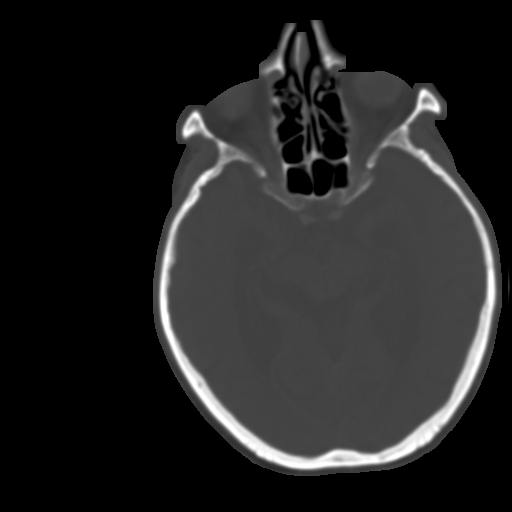
[im 18/32  brain]
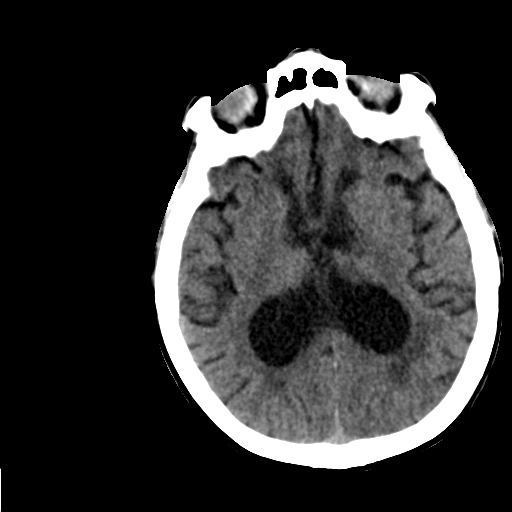
[im 20/32  brain]
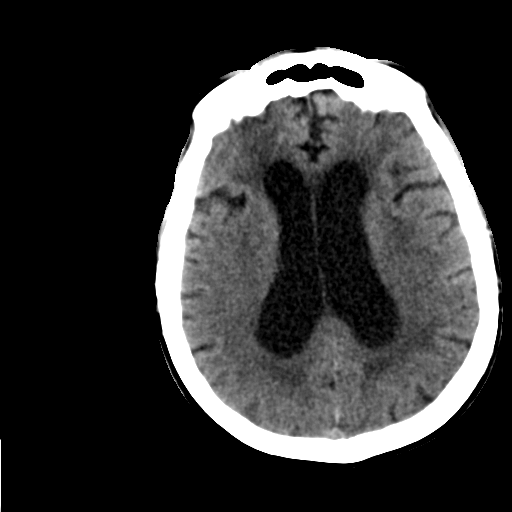
[im 23/32  brain]
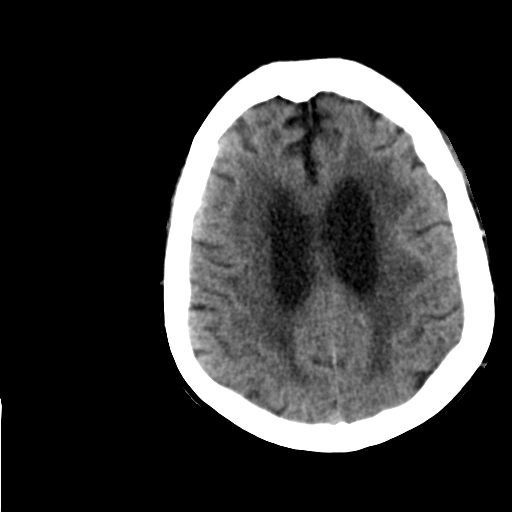
[im 27/32  brain]
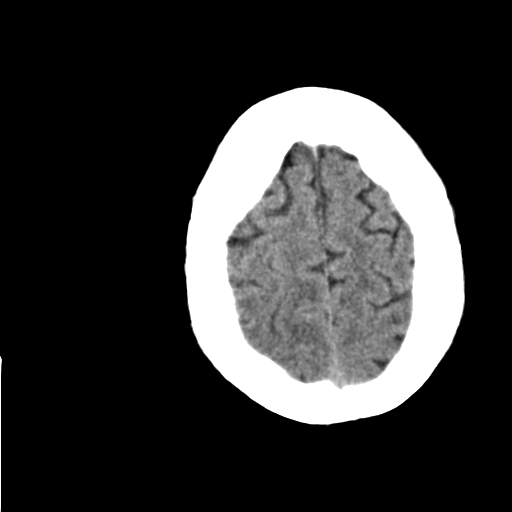
[im 27/32  bone]
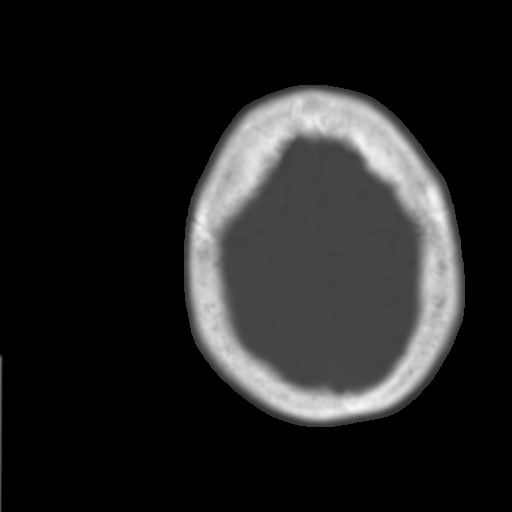
[im 29/32  brain]
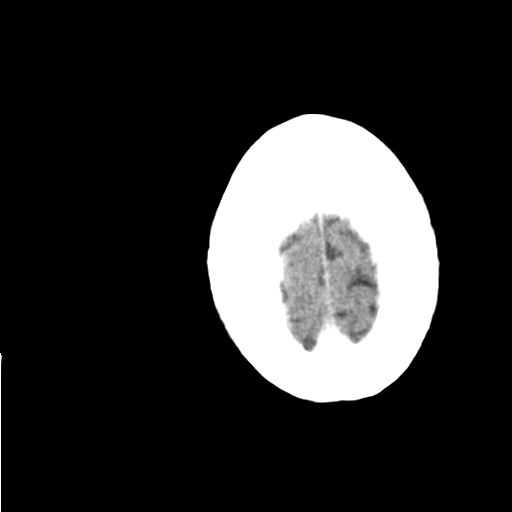

[Series 6: coronal soft tissue · coronal · 0.33mm/px · 3 of 65 slices shown]
[im 22/65  brain]
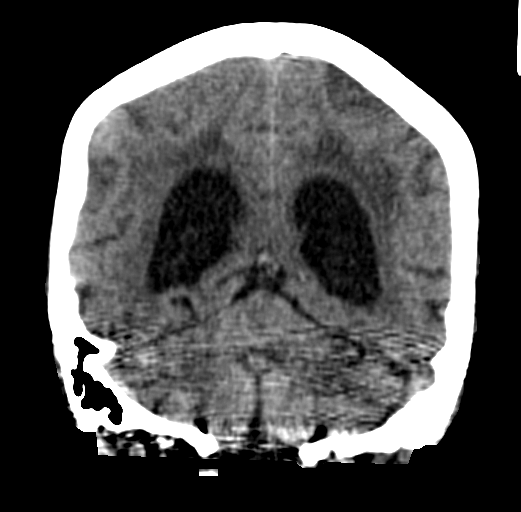
[im 29/65  brain]
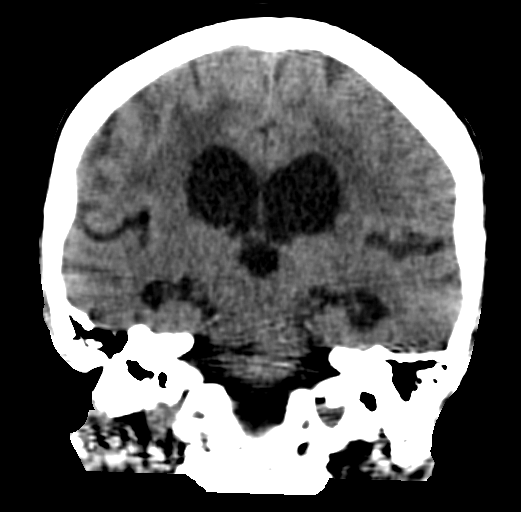
[im 36/65  brain]
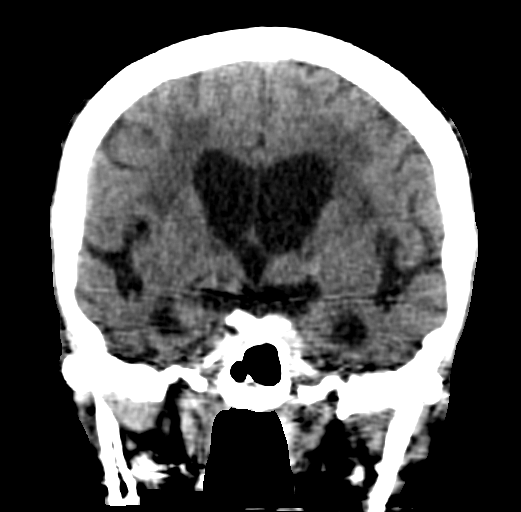

[Series 7: sagittal soft tissue · sagittal · 0.34mm/px · 3 of 51 slices shown]
[im 17/51  brain]
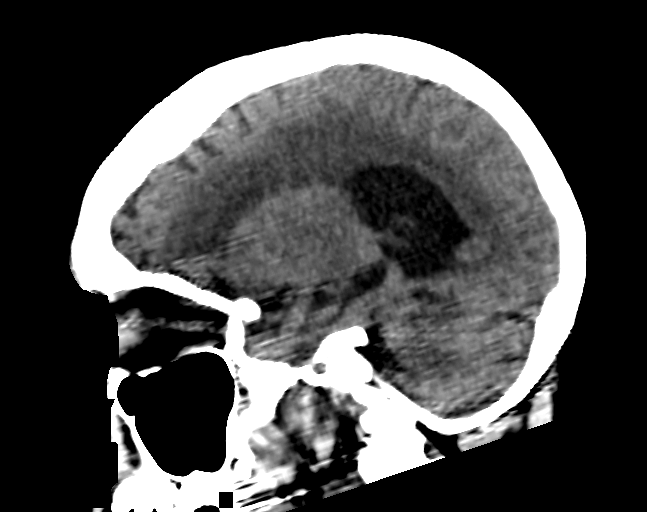
[im 26/51  brain]
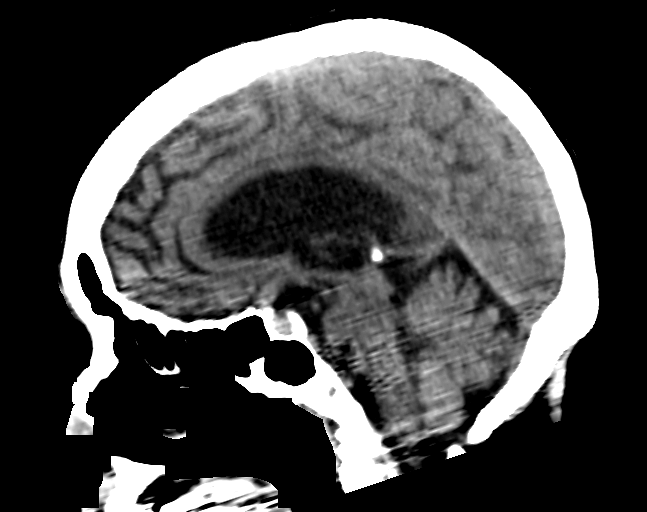
[im 34/51  brain]
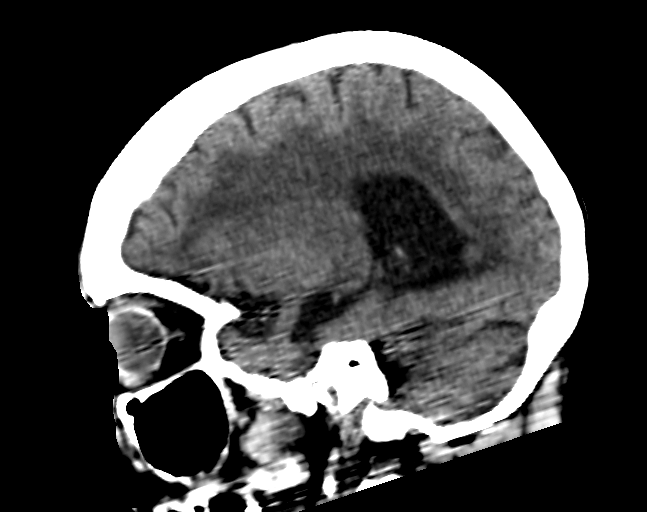

[16 of 47 positions shown; findings below may reference images not displayed]

FINDINGS: Moderate diffuse atrophy is stable. There is no intracranial mass,
hemorrhage, extra-axial fluid collection, or midline shift. There is
patchy small vessel disease throughout the centra semiovale
bilaterally. There is a prior small infarct in the inferior anterior
left thalamus, stable. No acute infarct is evident. The bony
calvarium appears intact. The mastoid air cells are clear. No
intraorbital lesions are evident.
IMPRESSION: Atrophy with periventricular small vessel disease. Prior small
infarct anterior left thalamus. No acute infarct evident. No
hemorrhage, extra-axial fluid collection, or mass effect.
